# Patient Record
Sex: Male | Born: 1985 | Hispanic: No | Marital: Single | Smoking: Never smoker
Health system: Southern US, Community
[De-identification: ages and names within clinical notes are randomized; demographics above are authoritative.]

## PROBLEM LIST (undated history)

## (undated) DIAGNOSIS — F79 Unspecified intellectual disabilities: Secondary | ICD-10-CM

## (undated) DIAGNOSIS — F319 Bipolar disorder, unspecified: Secondary | ICD-10-CM

## (undated) DIAGNOSIS — R569 Unspecified convulsions: Secondary | ICD-10-CM

## (undated) DIAGNOSIS — G809 Cerebral palsy, unspecified: Secondary | ICD-10-CM

## (undated) DIAGNOSIS — F6381 Intermittent explosive disorder: Secondary | ICD-10-CM

---
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
## 2006-06-25 ENCOUNTER — Emergency Department (HOSPITAL_COMMUNITY): Admission: EM | Admit: 2006-06-25 | Discharge: 2006-06-25 | Payer: Self-pay | Admitting: Emergency Medicine

## 2006-07-11 ENCOUNTER — Emergency Department (HOSPITAL_COMMUNITY): Admission: EM | Admit: 2006-07-11 | Discharge: 2006-07-12 | Payer: Self-pay | Admitting: Emergency Medicine

## 2006-08-20 ENCOUNTER — Emergency Department (HOSPITAL_COMMUNITY): Admission: EM | Admit: 2006-08-20 | Discharge: 2006-08-20 | Payer: Self-pay | Admitting: Emergency Medicine

## 2006-11-27 ENCOUNTER — Ambulatory Visit: Payer: Self-pay | Admitting: Internal Medicine

## 2006-11-27 LAB — CONVERTED CEMR LAB
ALT: 19 units/L
AST: 29 units/L
Creatinine, Ser: 0.72 mg/dL
HDL: 46 mg/dL
Hemoglobin: 13.7 g/dL
LDL Cholesterol: 94 mg/dL
WBC: 5.5 10*3/uL

## 2006-12-27 ENCOUNTER — Ambulatory Visit: Payer: Self-pay | Admitting: Internal Medicine

## 2007-03-07 ENCOUNTER — Ambulatory Visit: Payer: Self-pay | Admitting: Internal Medicine

## 2007-03-14 ENCOUNTER — Ambulatory Visit: Payer: Self-pay | Admitting: Internal Medicine

## 2007-04-09 ENCOUNTER — Ambulatory Visit: Payer: Self-pay | Admitting: Internal Medicine

## 2007-04-16 ENCOUNTER — Ambulatory Visit: Payer: Self-pay | Admitting: Internal Medicine

## 2007-05-15 ENCOUNTER — Encounter: Admission: RE | Admit: 2007-05-15 | Discharge: 2007-08-13 | Payer: Self-pay | Admitting: Internal Medicine

## 2007-05-20 ENCOUNTER — Emergency Department (HOSPITAL_COMMUNITY): Admission: EM | Admit: 2007-05-20 | Discharge: 2007-05-20 | Payer: Self-pay | Admitting: Emergency Medicine

## 2007-05-27 ENCOUNTER — Ambulatory Visit: Payer: Self-pay | Admitting: Internal Medicine

## 2007-05-29 ENCOUNTER — Emergency Department (HOSPITAL_COMMUNITY): Admission: EM | Admit: 2007-05-29 | Discharge: 2007-05-29 | Payer: Self-pay | Admitting: Emergency Medicine

## 2007-06-04 ENCOUNTER — Ambulatory Visit: Payer: Self-pay | Admitting: Internal Medicine

## 2007-06-11 ENCOUNTER — Ambulatory Visit: Payer: Self-pay | Admitting: Internal Medicine

## 2007-06-20 ENCOUNTER — Ambulatory Visit: Payer: Self-pay | Admitting: Internal Medicine

## 2007-06-20 DIAGNOSIS — G808 Other cerebral palsy: Secondary | ICD-10-CM

## 2007-06-20 DIAGNOSIS — F319 Bipolar disorder, unspecified: Secondary | ICD-10-CM

## 2007-06-20 DIAGNOSIS — F6381 Intermittent explosive disorder: Secondary | ICD-10-CM | POA: Insufficient documentation

## 2007-06-20 DIAGNOSIS — F71 Moderate intellectual disabilities: Secondary | ICD-10-CM

## 2007-06-20 DIAGNOSIS — F911 Conduct disorder, childhood-onset type: Secondary | ICD-10-CM

## 2007-06-20 DIAGNOSIS — R569 Unspecified convulsions: Secondary | ICD-10-CM

## 2007-07-29 ENCOUNTER — Encounter (INDEPENDENT_AMBULATORY_CARE_PROVIDER_SITE_OTHER): Payer: Self-pay | Admitting: Internal Medicine

## 2007-08-01 ENCOUNTER — Telehealth (INDEPENDENT_AMBULATORY_CARE_PROVIDER_SITE_OTHER): Payer: Self-pay | Admitting: Internal Medicine

## 2007-08-20 ENCOUNTER — Encounter (INDEPENDENT_AMBULATORY_CARE_PROVIDER_SITE_OTHER): Payer: Self-pay | Admitting: Internal Medicine

## 2007-08-20 ENCOUNTER — Telehealth (INDEPENDENT_AMBULATORY_CARE_PROVIDER_SITE_OTHER): Payer: Self-pay | Admitting: Internal Medicine

## 2007-08-20 DIAGNOSIS — R269 Unspecified abnormalities of gait and mobility: Secondary | ICD-10-CM | POA: Insufficient documentation

## 2007-08-27 ENCOUNTER — Encounter (INDEPENDENT_AMBULATORY_CARE_PROVIDER_SITE_OTHER): Payer: Self-pay | Admitting: Internal Medicine

## 2007-08-28 ENCOUNTER — Encounter: Admission: RE | Admit: 2007-08-28 | Discharge: 2007-09-19 | Payer: Self-pay | Admitting: Internal Medicine

## 2007-09-02 ENCOUNTER — Encounter (INDEPENDENT_AMBULATORY_CARE_PROVIDER_SITE_OTHER): Payer: Self-pay | Admitting: Internal Medicine

## 2007-09-24 ENCOUNTER — Ambulatory Visit: Payer: Self-pay | Admitting: Internal Medicine

## 2007-09-30 ENCOUNTER — Encounter (INDEPENDENT_AMBULATORY_CARE_PROVIDER_SITE_OTHER): Payer: Self-pay | Admitting: Internal Medicine

## 2007-11-14 ENCOUNTER — Encounter (INDEPENDENT_AMBULATORY_CARE_PROVIDER_SITE_OTHER): Payer: Self-pay | Admitting: Internal Medicine

## 2007-12-17 DIAGNOSIS — J209 Acute bronchitis, unspecified: Secondary | ICD-10-CM

## 2007-12-20 ENCOUNTER — Encounter (INDEPENDENT_AMBULATORY_CARE_PROVIDER_SITE_OTHER): Payer: Self-pay | Admitting: Internal Medicine

## 2007-12-22 ENCOUNTER — Encounter (INDEPENDENT_AMBULATORY_CARE_PROVIDER_SITE_OTHER): Payer: Self-pay | Admitting: Internal Medicine

## 2008-01-05 ENCOUNTER — Emergency Department (HOSPITAL_COMMUNITY): Admission: EM | Admit: 2008-01-05 | Discharge: 2008-01-05 | Payer: Self-pay | Admitting: Emergency Medicine

## 2008-01-10 ENCOUNTER — Ambulatory Visit: Payer: Self-pay | Admitting: Internal Medicine

## 2008-01-10 ENCOUNTER — Telehealth (INDEPENDENT_AMBULATORY_CARE_PROVIDER_SITE_OTHER): Payer: Self-pay | Admitting: Internal Medicine

## 2008-01-21 ENCOUNTER — Encounter (INDEPENDENT_AMBULATORY_CARE_PROVIDER_SITE_OTHER): Payer: Self-pay | Admitting: Internal Medicine

## 2008-01-24 ENCOUNTER — Ambulatory Visit: Payer: Self-pay | Admitting: Internal Medicine

## 2008-01-26 ENCOUNTER — Encounter (INDEPENDENT_AMBULATORY_CARE_PROVIDER_SITE_OTHER): Payer: Self-pay | Admitting: Internal Medicine

## 2008-02-01 ENCOUNTER — Encounter (INDEPENDENT_AMBULATORY_CARE_PROVIDER_SITE_OTHER): Payer: Self-pay | Admitting: Internal Medicine

## 2008-02-27 ENCOUNTER — Encounter (INDEPENDENT_AMBULATORY_CARE_PROVIDER_SITE_OTHER): Payer: Self-pay | Admitting: Internal Medicine

## 2008-03-07 ENCOUNTER — Emergency Department (HOSPITAL_COMMUNITY): Admission: EM | Admit: 2008-03-07 | Discharge: 2008-03-07 | Payer: Self-pay | Admitting: Emergency Medicine

## 2008-03-23 ENCOUNTER — Encounter (INDEPENDENT_AMBULATORY_CARE_PROVIDER_SITE_OTHER): Payer: Self-pay | Admitting: Internal Medicine

## 2008-05-21 ENCOUNTER — Encounter (INDEPENDENT_AMBULATORY_CARE_PROVIDER_SITE_OTHER): Payer: Self-pay | Admitting: Internal Medicine

## 2008-05-22 ENCOUNTER — Ambulatory Visit: Payer: Self-pay | Admitting: Internal Medicine

## 2008-06-18 ENCOUNTER — Encounter (INDEPENDENT_AMBULATORY_CARE_PROVIDER_SITE_OTHER): Payer: Self-pay | Admitting: Internal Medicine

## 2008-06-22 ENCOUNTER — Encounter (INDEPENDENT_AMBULATORY_CARE_PROVIDER_SITE_OTHER): Payer: Self-pay | Admitting: Internal Medicine

## 2008-07-06 ENCOUNTER — Ambulatory Visit: Payer: Self-pay | Admitting: Internal Medicine

## 2008-07-22 ENCOUNTER — Telehealth (INDEPENDENT_AMBULATORY_CARE_PROVIDER_SITE_OTHER): Payer: Self-pay | Admitting: Internal Medicine

## 2008-07-28 ENCOUNTER — Encounter (INDEPENDENT_AMBULATORY_CARE_PROVIDER_SITE_OTHER): Payer: Self-pay | Admitting: Internal Medicine

## 2008-08-27 ENCOUNTER — Ambulatory Visit: Payer: Self-pay | Admitting: Internal Medicine

## 2008-08-30 LAB — CONVERTED CEMR LAB: Valproic Acid Lvl: 106.9 ug/mL — ABNORMAL HIGH (ref 50.0–100.0)

## 2008-09-02 ENCOUNTER — Telehealth (INDEPENDENT_AMBULATORY_CARE_PROVIDER_SITE_OTHER): Payer: Self-pay | Admitting: Internal Medicine

## 2008-09-28 ENCOUNTER — Ambulatory Visit: Payer: Self-pay | Admitting: Internal Medicine

## 2008-10-05 LAB — CONVERTED CEMR LAB: Valproic Acid Lvl: 83.2 ug/mL (ref 50.0–100.0)

## 2008-10-28 ENCOUNTER — Encounter (INDEPENDENT_AMBULATORY_CARE_PROVIDER_SITE_OTHER): Payer: Self-pay | Admitting: Internal Medicine

## 2008-11-30 ENCOUNTER — Encounter (INDEPENDENT_AMBULATORY_CARE_PROVIDER_SITE_OTHER): Payer: Self-pay | Admitting: Internal Medicine

## 2008-12-11 ENCOUNTER — Telehealth (INDEPENDENT_AMBULATORY_CARE_PROVIDER_SITE_OTHER): Payer: Self-pay | Admitting: Internal Medicine

## 2008-12-30 ENCOUNTER — Encounter (INDEPENDENT_AMBULATORY_CARE_PROVIDER_SITE_OTHER): Payer: Self-pay | Admitting: Internal Medicine

## 2009-01-04 ENCOUNTER — Encounter (INDEPENDENT_AMBULATORY_CARE_PROVIDER_SITE_OTHER): Payer: Self-pay | Admitting: Internal Medicine

## 2009-01-25 ENCOUNTER — Encounter (INDEPENDENT_AMBULATORY_CARE_PROVIDER_SITE_OTHER): Payer: Self-pay | Admitting: Internal Medicine

## 2009-02-15 ENCOUNTER — Encounter (INDEPENDENT_AMBULATORY_CARE_PROVIDER_SITE_OTHER): Payer: Self-pay | Admitting: Internal Medicine

## 2009-03-17 ENCOUNTER — Ambulatory Visit: Payer: Self-pay | Admitting: Internal Medicine

## 2009-03-18 ENCOUNTER — Emergency Department (HOSPITAL_COMMUNITY): Admission: EM | Admit: 2009-03-18 | Discharge: 2009-03-18 | Payer: Self-pay | Admitting: *Deleted

## 2009-04-16 ENCOUNTER — Telehealth (INDEPENDENT_AMBULATORY_CARE_PROVIDER_SITE_OTHER): Payer: Self-pay | Admitting: Internal Medicine

## 2009-05-04 ENCOUNTER — Encounter (INDEPENDENT_AMBULATORY_CARE_PROVIDER_SITE_OTHER): Payer: Self-pay | Admitting: Internal Medicine

## 2009-05-05 ENCOUNTER — Emergency Department (HOSPITAL_COMMUNITY): Admission: EM | Admit: 2009-05-05 | Discharge: 2009-05-05 | Payer: Self-pay | Admitting: Family Medicine

## 2009-05-19 ENCOUNTER — Encounter (INDEPENDENT_AMBULATORY_CARE_PROVIDER_SITE_OTHER): Payer: Self-pay | Admitting: Internal Medicine

## 2009-05-25 LAB — CONVERTED CEMR LAB: Valproic Acid Lvl: 104.6 ug/mL — ABNORMAL HIGH (ref 50.0–100.0)

## 2009-05-31 ENCOUNTER — Emergency Department (HOSPITAL_COMMUNITY): Admission: EM | Admit: 2009-05-31 | Discharge: 2009-05-31 | Payer: Self-pay | Admitting: Emergency Medicine

## 2009-05-31 ENCOUNTER — Encounter (INDEPENDENT_AMBULATORY_CARE_PROVIDER_SITE_OTHER): Payer: Self-pay | Admitting: Internal Medicine

## 2009-08-13 ENCOUNTER — Ambulatory Visit: Payer: Self-pay | Admitting: Internal Medicine

## 2009-08-30 LAB — CONVERTED CEMR LAB
Alkaline Phosphatase: 73 units/L (ref 39–117)
BUN: 17 mg/dL (ref 6–23)
CO2: 25 meq/L (ref 19–32)
Creatinine, Ser: 0.85 mg/dL (ref 0.40–1.50)
Hemoglobin: 14.1 g/dL (ref 13.0–17.0)
MCHC: 30.7 g/dL (ref 30.0–36.0)
MCV: 85.8 fL (ref 78.0–100.0)
Potassium: 4.7 meq/L (ref 3.5–5.3)
RBC: 5.35 M/uL (ref 4.22–5.81)
RDW: 14.2 % (ref 11.5–15.5)
Sodium: 142 meq/L (ref 135–145)
Total Bilirubin: 0.4 mg/dL (ref 0.3–1.2)
Total Protein: 7.8 g/dL (ref 6.0–8.3)
WBC: 5.2 10*3/uL (ref 4.0–10.5)

## 2010-02-02 ENCOUNTER — Encounter: Admission: RE | Admit: 2010-02-02 | Discharge: 2010-02-02 | Payer: Self-pay | Admitting: Neurology

## 2010-06-19 ENCOUNTER — Emergency Department (HOSPITAL_COMMUNITY): Admission: EM | Admit: 2010-06-19 | Discharge: 2010-06-19 | Payer: Self-pay | Admitting: Emergency Medicine

## 2010-12-22 ENCOUNTER — Telehealth (INDEPENDENT_AMBULATORY_CARE_PROVIDER_SITE_OTHER): Payer: Self-pay | Admitting: Internal Medicine

## 2011-01-10 NOTE — Progress Notes (Signed)
Summary: Needs PA for Clobex -- NO LONGER OUR PATIENT  Phone Note Outgoing Call   Summary of Call: Received prior authorization request for Clobex .  Pt. doesn't show a history of having tried and failed preferred meds.  Does not seem to have been refilled recently.  Do you want to change this medication to a preferred?  Preferred list in your refill rack.  Initial call taken by: Garrett Quint RN,  December 22, 2010 12:54 PM  Follow-up for Phone Call        I do not believe he is still a patient here--need to call pharmacy or [facility] where he lives and clarify. Has not been seen here since 2010 Follow-up by: Garrett Manson MD,  January 03, 2011 8:53 AM  Additional Follow-up for Phone Call Additional follow up Details #1::        Spoke with Garrett Shaffer at pharmacy and Garrett Shaffer at [facility].  Per [facility], Garrett Shaffer is no longer his provider; he is being followed by a Garrett Shaffer.  I called [facility] and informed them to change pt.'s provider to Garrett Shaffer and to call [facility] for any further clarification.  Garrett Quint RN  January 03, 2011 12:26 PM

## 2011-03-02 ENCOUNTER — Ambulatory Visit: Payer: Medicaid Other | Attending: Family Medicine | Admitting: Physical Therapy

## 2011-03-02 DIAGNOSIS — IMO0001 Reserved for inherently not codable concepts without codable children: Secondary | ICD-10-CM | POA: Insufficient documentation

## 2011-03-02 DIAGNOSIS — F79 Unspecified intellectual disabilities: Secondary | ICD-10-CM | POA: Insufficient documentation

## 2011-03-02 DIAGNOSIS — R269 Unspecified abnormalities of gait and mobility: Secondary | ICD-10-CM | POA: Insufficient documentation

## 2011-03-02 DIAGNOSIS — G809 Cerebral palsy, unspecified: Secondary | ICD-10-CM | POA: Insufficient documentation
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
## 2011-03-31 NOTE — Op Note (Signed)
NAME:  Garrett Shaffer, MALEK                          ACCOUNT NO.:  [id number]   MEDICAL RECORD NO.:  [id number]                   PATIENT TYPE:  INP   LOCATION:  2302                                 FACILITY:  [facility]   PHYSICIAN:  Mark C. Ophelia Charter, M.D.                 DATE OF BIRTH:  10/09/1986   DATE OF PROCEDURE:  05/16/2004  DATE OF DISCHARGE:                                 OPERATIVE REPORT   PREOPERATIVE DIAGNOSIS:  Right proximal forearm laceration with significant  arterial injury.   POSTOPERATIVE DIAGNOSIS:  Right forearm laceration, laceration of  brachioradialis, superficialis, radial and ulnar arteries and median nerve.   OPERATION PERFORMED:  Exploration and repair of median nerve,  brachioradialis and superficialis muscle.   SURGEON:  Mark C. Ophelia Charter, M.D.   ASSISTANT:  Balinda Quails, M.D.   ANESTHESIA:  GOT.   NOTE:  Balinda Quails, M.D. performed the radial and ulnar artery repair and  I assisted him with this.  I made the exposure.  Dr. Madilyn Fireman performed the  vessel repairs and then the operative microscope was brought in and I  performed the median nerve repair with Dr. Madilyn Fireman assisting.   COMPLICATIONS:   INDICATIONS FOR PROCEDURE:  The patient had a volar approximately 5 cm  laceration with packing placed and had a tourniquet on.  I did not see the  patient in the emergency room.  I was called by Dr. Luan Pulling on an  emergent basis and arrived at the hospital five minutes later.  He was in  the operating room.    Distally, the arm was prepped and I covered it with stockinette and held it  while the nonsterile tourniquet was removed and pressure was held directly  over the wound to prevent blood loss and the proximal arm was prepped by Dr.  Madilyn Fireman and then standard draping was performed for the extremity.  Split  sheets were used.  Incision was extended distally on the radial side and  proximally on the ulnar side passing across the antecubital crease in an  oblique  manner.  Dr. Madilyn Fireman performed the dissection.  Brachioradialis muscle  was cut.  There were lacerations to the radial and ulnar artery, please see  the description and this was present near the arcade with numerous small  veins and arteries.  After he performed the radial and ulnar artery repair,  the distal median nerve was present and the median nerve was followed from  proximal and I dissected out the proximal portion of the nerve, mobilized  it.  With the elbow flexed wrist flexed for the artery repair, once artery  repairs were repeated, operating microscope was brought in using the Leica  scope adjusted.  A 25 needle was placed in the distal nerve holding it and 8-  0 nylon epineurial sutures were used. The anterior interosseous branch was  intact and arch bundle sensory branch  was repaired.  There was a smaller  group of fascicles which may have been the motor portion of the median nerve  smaller and this was repaired as well.  A total of about 10 sutures were  placed until all __________ fascicles were tucked in nicely.  After  irrigation with saline solution, brachioradialis was repaired with 2-0  Vicryl superficial repair of the sublimis muscle belly was performed.  Distal head of the biceps tendon was not lacerated and was intact.  After  irrigation with saline solution, 2-0 Vicryl was placed in the subcutaneous  tissue and skin staple closure.  Posterior splint was applied with the wrist  45 degrees flexed, elbow at 90.  Dopplers were used and radial artery was  marked with skin marker and splint was fashioned so that there was exposure  to the radial artery for Doppler testing  by the nurses in the intensive  care postop.  Since the patient had massive blood loss, had hyperkalemia,  had some arrhythmias and a significant amount of transfusions, he was  transferred directly from the operating room to the ICU intubated.  The  patient tolerated the procedure well.                                                Mark C. Ophelia Charter, M.D.    MCY/MEDQ  D:  05/16/2004  T:  05/17/2004  Job:  [id number]
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
## 2011-09-20 ENCOUNTER — Encounter: Payer: Self-pay | Admitting: Adult Health

## 2011-09-20 ENCOUNTER — Emergency Department (HOSPITAL_COMMUNITY)
Admission: EM | Admit: 2011-09-20 | Discharge: 2011-09-21 | Disposition: A | Discharge status: Home / Self Care | Payer: Medicaid Other | Attending: Emergency Medicine | Admitting: Emergency Medicine

## 2011-09-20 DIAGNOSIS — F79 Unspecified intellectual disabilities: Secondary | ICD-10-CM | POA: Insufficient documentation

## 2011-09-20 DIAGNOSIS — K625 Hemorrhage of anus and rectum: Secondary | ICD-10-CM

## 2011-09-20 DIAGNOSIS — Z79899 Other long term (current) drug therapy: Secondary | ICD-10-CM | POA: Insufficient documentation

## 2011-09-20 DIAGNOSIS — F319 Bipolar disorder, unspecified: Secondary | ICD-10-CM | POA: Insufficient documentation

## 2011-09-20 HISTORY — DX: Unspecified convulsions: R56.9

## 2011-09-20 HISTORY — DX: Intermittent explosive disorder: F63.81

## 2011-09-20 HISTORY — DX: Cerebral palsy, unspecified: G80.9

## 2011-09-20 HISTORY — DX: Unspecified intellectual disabilities: F79

## 2011-09-20 HISTORY — DX: Bipolar disorder, unspecified: F31.9

## 2011-09-20 LAB — CBC
Hemoglobin: 13.9 g/dL (ref 13.0–17.0)
MCH: 25.9 pg — ABNORMAL LOW (ref 26.0–34.0)
Platelets: 232 10*3/uL (ref 150–400)
RBC: 5.36 MIL/uL (ref 4.22–5.81)
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
## 2011-09-20 NOTE — ED Notes (Signed)
SANE nurse here to see pt 
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
## 2011-09-20 NOTE — ED Notes (Signed)
SANE nurse states she is done with her assessment

## 2011-09-20 NOTE — ED Notes (Signed)
[employer] talking with room 1

## 2011-09-26 ENCOUNTER — Encounter: Payer: Self-pay | Admitting: Gastroenterology

## 2011-10-16 ENCOUNTER — Ambulatory Visit: Payer: Medicaid Other | Admitting: Gastroenterology

## 2013-05-07 ENCOUNTER — Encounter (HOSPITAL_COMMUNITY): Payer: Self-pay | Admitting: Emergency Medicine

## 2013-05-07 ENCOUNTER — Emergency Department (HOSPITAL_COMMUNITY)
Admission: EM | Admit: 2013-05-07 | Discharge: 2013-05-07 | Payer: Medicaid Other | Attending: Emergency Medicine | Admitting: Emergency Medicine

## 2013-05-07 DIAGNOSIS — F911 Conduct disorder, childhood-onset type: Secondary | ICD-10-CM | POA: Insufficient documentation

## 2013-05-07 DIAGNOSIS — Z8659 Personal history of other mental and behavioral disorders: Secondary | ICD-10-CM | POA: Insufficient documentation

## 2013-05-07 DIAGNOSIS — F319 Bipolar disorder, unspecified: Secondary | ICD-10-CM | POA: Insufficient documentation

## 2013-05-07 DIAGNOSIS — IMO0002 Reserved for concepts with insufficient information to code with codable children: Secondary | ICD-10-CM | POA: Insufficient documentation

## 2013-05-07 DIAGNOSIS — R4689 Other symptoms and signs involving appearance and behavior: Secondary | ICD-10-CM

## 2013-05-07 DIAGNOSIS — Z8669 Personal history of other diseases of the nervous system and sense organs: Secondary | ICD-10-CM | POA: Insufficient documentation

## 2013-05-07 DIAGNOSIS — T07XXXA Unspecified multiple injuries, initial encounter: Secondary | ICD-10-CM

## 2013-05-07 DIAGNOSIS — X838XXA Intentional self-harm by other specified means, initial encounter: Secondary | ICD-10-CM | POA: Insufficient documentation

## 2013-05-07 DIAGNOSIS — G40909 Epilepsy, unspecified, not intractable, without status epilepticus: Secondary | ICD-10-CM | POA: Insufficient documentation

## 2013-05-07 DIAGNOSIS — Z79899 Other long term (current) drug therapy: Secondary | ICD-10-CM | POA: Insufficient documentation

## 2013-05-07 LAB — BASIC METABOLIC PANEL
CO2: 26 mEq/L (ref 19–32)
Chloride: 99 mEq/L (ref 96–112)
Creatinine, Ser: 0.75 mg/dL (ref 0.50–1.35)
GFR calc Af Amer: >90 mL/min (ref >90)
Potassium: 4.2 mEq/L (ref 3.5–5.1)
Sodium: 136 mEq/L (ref 135–145)

## 2013-05-07 LAB — CARBAMAZEPINE LEVEL, TOTAL: Carbamazepine Lvl: 4.8 ug/mL (ref 4.0–12.0)

## 2013-05-07 LAB — VALPROIC ACID LEVEL: Valproic Acid Lvl: 51.8 ug/mL (ref 50.0–100.0)
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
## 2013-05-07 NOTE — ED Notes (Signed)
Bed:WA11<BR> Expected date:<BR> Expected time:<BR> Means of arrival:<BR> Comments:<BR> EMS
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
## 2013-05-07 NOTE — ED Provider Notes (Signed)
See prior note   Ward Givens, MD 05/07/13 2302

## 2013-08-02 ENCOUNTER — Encounter (HOSPITAL_COMMUNITY): Payer: Self-pay | Admitting: Emergency Medicine

## 2013-08-02 ENCOUNTER — Emergency Department (HOSPITAL_COMMUNITY)
Admission: EM | Admit: 2013-08-02 | Discharge: 2013-08-03 | Disposition: A | Discharge status: Home / Self Care | Payer: Medicaid Other | Attending: Emergency Medicine | Admitting: Emergency Medicine

## 2013-08-02 DIAGNOSIS — G40909 Epilepsy, unspecified, not intractable, without status epilepticus: Secondary | ICD-10-CM | POA: Insufficient documentation

## 2013-08-02 DIAGNOSIS — F319 Bipolar disorder, unspecified: Secondary | ICD-10-CM | POA: Insufficient documentation

## 2013-08-02 DIAGNOSIS — Z79899 Other long term (current) drug therapy: Secondary | ICD-10-CM | POA: Insufficient documentation

## 2013-08-02 DIAGNOSIS — F6381 Intermittent explosive disorder: Secondary | ICD-10-CM | POA: Insufficient documentation

## 2013-08-02 LAB — CBC
HCT: 42.2 % (ref 39.0–52.0)
Hemoglobin: 13.3 g/dL (ref 13.0–17.0)
MCH: 26.3 pg (ref 26.0–34.0)
MCHC: 31.5 g/dL (ref 30.0–36.0)
MCV: 83.4 fL (ref 78.0–100.0)
Platelets: 276 10*3/uL (ref 150–400)
RBC: 5.06 MIL/uL (ref 4.22–5.81)
RDW: 14.2 % (ref 11.5–15.5)
WBC: 9.3 10*3/uL (ref 4.0–10.5)

## 2013-08-02 LAB — RAPID URINE DRUG SCREEN, HOSP PERFORMED
Amphetamines: NOT DETECTED
Barbiturates: NOT DETECTED
Benzodiazepines: NOT DETECTED
Cocaine: NOT DETECTED
Opiates: NOT DETECTED
Tetrahydrocannabinol: NOT DETECTED
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
## 2013-08-03 ENCOUNTER — Encounter (HOSPITAL_COMMUNITY): Payer: Self-pay | Admitting: *Deleted

## 2013-08-03 LAB — ACETAMINOPHEN LEVEL: Acetaminophen (Tylenol), Serum: <15 ug/mL (ref 10–30)

## 2013-08-03 LAB — COMPREHENSIVE METABOLIC PANEL
ALT: 31 U/L (ref 0–53)
Albumin: 3.6 g/dL (ref 3.5–5.2)
Alkaline Phosphatase: 112 U/L (ref 39–117)
BUN: 10 mg/dL (ref 6–23)
Chloride: 98 mEq/L (ref 96–112)
GFR calc Af Amer: >90 mL/min (ref >90)
Glucose, Bld: 117 mg/dL — ABNORMAL HIGH (ref 70–99)
Potassium: 4.3 mEq/L (ref 3.5–5.1)
Sodium: 137 mEq/L (ref 135–145)
Total Bilirubin: 0.2 mg/dL — ABNORMAL LOW (ref 0.3–1.2)
Total Protein: 8.2 g/dL (ref 6.0–8.3)

## 2013-08-03 NOTE — ED Provider Notes (Signed)
Medical screening examination/treatment/procedure(s) were performed by non-physician practitioner and as supervising physician I was immediately available for consultation/collaboration.   Gilda Crease, MD 08/03/13 [phone]

## 2013-08-03 NOTE — Progress Notes (Signed)
Reviewed the information documented and agree with the treatment plan.  Harm Jou,JANARDHAHA R. 08/03/2013 3:51 PM
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
## 2015-08-20 ENCOUNTER — Emergency Department (HOSPITAL_COMMUNITY): Payer: Medicaid Other

## 2015-08-20 ENCOUNTER — Inpatient Hospital Stay (HOSPITAL_COMMUNITY)
Admission: EM | Admit: 2015-08-20 | Discharge: 2015-08-24 | DRG: 916 | Disposition: A | Discharge status: Home / Self Care | Payer: Medicaid Other | Attending: Internal Medicine | Admitting: Internal Medicine

## 2015-08-20 ENCOUNTER — Encounter (HOSPITAL_COMMUNITY): Payer: Self-pay | Admitting: *Deleted

## 2015-08-20 DIAGNOSIS — G40909 Epilepsy, unspecified, not intractable, without status epilepticus: Secondary | ICD-10-CM | POA: Diagnosis present

## 2015-08-20 DIAGNOSIS — K219 Gastro-esophageal reflux disease without esophagitis: Secondary | ICD-10-CM | POA: Diagnosis present

## 2015-08-20 DIAGNOSIS — F6381 Intermittent explosive disorder: Secondary | ICD-10-CM | POA: Diagnosis present

## 2015-08-20 DIAGNOSIS — F3162 Bipolar disorder, current episode mixed, moderate: Secondary | ICD-10-CM | POA: Insufficient documentation

## 2015-08-20 DIAGNOSIS — E872 Acidosis, unspecified: Secondary | ICD-10-CM | POA: Insufficient documentation

## 2015-08-20 DIAGNOSIS — R05 Cough: Secondary | ICD-10-CM

## 2015-08-20 DIAGNOSIS — R061 Stridor: Secondary | ICD-10-CM | POA: Diagnosis present

## 2015-08-20 DIAGNOSIS — R0902 Hypoxemia: Secondary | ICD-10-CM | POA: Diagnosis present

## 2015-08-20 DIAGNOSIS — J988 Other specified respiratory disorders: Secondary | ICD-10-CM | POA: Insufficient documentation

## 2015-08-20 DIAGNOSIS — D72829 Elevated white blood cell count, unspecified: Secondary | ICD-10-CM | POA: Insufficient documentation

## 2015-08-20 DIAGNOSIS — F79 Unspecified intellectual disabilities: Secondary | ICD-10-CM | POA: Diagnosis present

## 2015-08-20 DIAGNOSIS — G809 Cerebral palsy, unspecified: Secondary | ICD-10-CM | POA: Diagnosis present

## 2015-08-20 DIAGNOSIS — R059 Cough, unspecified: Secondary | ICD-10-CM

## 2015-08-20 DIAGNOSIS — T782XXA Anaphylactic shock, unspecified, initial encounter: Secondary | ICD-10-CM | POA: Diagnosis present

## 2015-08-20 DIAGNOSIS — R0603 Acute respiratory distress: Secondary | ICD-10-CM | POA: Insufficient documentation

## 2015-08-20 DIAGNOSIS — F319 Bipolar disorder, unspecified: Secondary | ICD-10-CM | POA: Diagnosis present

## 2015-08-20 LAB — CBC WITH DIFFERENTIAL/PLATELET
Basophils Absolute: 0 10*3/uL (ref 0.0–0.1)
Basophils Relative: 0 %
Eosinophils Absolute: 0.4 10*3/uL (ref 0.0–0.7)
Eosinophils Relative: 3 %
HEMATOCRIT: 42 % (ref 39.0–52.0)
HEMOGLOBIN: 13.6 g/dL (ref 13.0–17.0)
LYMPHS ABS: 3.4 10*3/uL (ref 0.7–4.0)
LYMPHS PCT: 27 %
MCH: 27.1 pg (ref 26.0–34.0)
MCHC: 32.4 g/dL (ref 30.0–36.0)
MCV: 83.8 fL (ref 78.0–100.0)
MONOS PCT: 10 %
Monocytes Absolute: 1.3 10*3/uL — ABNORMAL HIGH (ref 0.1–1.0)
NEUTROS ABS: 7.4 10*3/uL (ref 1.7–7.7)
NEUTROS PCT: 60 %
Platelets: 237 10*3/uL (ref 150–400)
RBC: 5.01 MIL/uL (ref 4.22–5.81)
RDW: 13.9 % (ref 11.5–15.5)
WBC: 12.5 10*3/uL — ABNORMAL HIGH (ref 4.0–10.5)

## 2015-08-20 LAB — I-STAT CG4 LACTIC ACID, ED: LACTIC ACID, VENOUS: 1.89 mmol/L (ref 0.5–2.0)

## 2015-08-20 LAB — I-STAT ARTERIAL BLOOD GAS, ED
ACID-BASE EXCESS: 1 mmol/L (ref 0.0–2.0)
BICARBONATE: 25.3 meq/L — AB (ref 20.0–24.0)
O2 SAT: 98 %
PCO2 ART: 38.1 mmHg (ref 35.0–45.0)
PO2 ART: 96 mmHg (ref 80.0–100.0)
Patient temperature: 98.7
TCO2: 26 mmol/L (ref 0–100)
pH, Arterial: 7.43 (ref 7.350–7.450)

## 2015-08-20 LAB — BASIC METABOLIC PANEL
ANION GAP: 10 (ref 5–15)
BUN: 6 mg/dL (ref 6–20)
CHLORIDE: 99 mmol/L — AB (ref 101–111)
CO2: 24 mmol/L (ref 22–32)
CREATININE: 0.71 mg/dL (ref 0.61–1.24)
Calcium: 8.9 mg/dL (ref 8.9–10.3)
GFR calc non Af Amer: >60 mL/min (ref >60)
Glucose, Bld: 125 mg/dL — ABNORMAL HIGH (ref 65–99)
POTASSIUM: 4.2 mmol/L (ref 3.5–5.1)
Sodium: 133 mmol/L — ABNORMAL LOW (ref 135–145)

## 2015-08-20 LAB — I-STAT TROPONIN, ED: Troponin i, poc: 0 ng/mL (ref 0.00–0.08)

## 2015-08-20 MED ORDER — RACEPINEPHRINE HCL 2.25 % IN NEBU
INHALATION_SOLUTION | RESPIRATORY_TRACT | Status: AC
  Filled 2015-08-20: qty 0.5

## 2015-08-20 MED ORDER — ALBUTEROL (5 MG/ML) CONTINUOUS INHALATION SOLN
INHALATION_SOLUTION | RESPIRATORY_TRACT | Status: AC
  Filled 2015-08-20: qty 20

## 2015-08-20 MED ORDER — METHYLPREDNISOLONE SODIUM SUCC 125 MG IJ SOLR
INTRAMUSCULAR | Status: AC
  Administered 2015-08-20: 125 mg
  Filled 2015-08-20: qty 2

## 2015-08-20 MED ORDER — IPRATROPIUM BROMIDE 0.02 % IN SOLN
0.5000 mg | Freq: Once | RESPIRATORY_TRACT | Status: AC
  Administered 2015-08-20: 0.5 mg via RESPIRATORY_TRACT

## 2015-08-20 MED ORDER — RACEPINEPHRINE HCL 2.25 % IN NEBU
0.5000 mL | INHALATION_SOLUTION | Freq: Once | RESPIRATORY_TRACT | Status: AC
  Administered 2015-08-20: 0.5 mL via RESPIRATORY_TRACT

## 2015-08-20 MED ORDER — ALBUTEROL (5 MG/ML) CONTINUOUS INHALATION SOLN
20.0000 mg | INHALATION_SOLUTION | Freq: Once | RESPIRATORY_TRACT | Status: AC
  Administered 2015-08-20: 20 mg via RESPIRATORY_TRACT

## 2015-08-20 MED ORDER — EPINEPHRINE 0.3 MG/0.3ML IJ SOAJ
0.3000 mg | Freq: Once | INTRAMUSCULAR | Status: AC
  Administered 2015-08-20: 0.3 mg via INTRAMUSCULAR
  Filled 2015-08-20: qty 0.3

## 2015-08-20 MED ORDER — ALBUTEROL SULFATE (2.5 MG/3ML) 0.083% IN NEBU
5.0000 mg | INHALATION_SOLUTION | Freq: Once | RESPIRATORY_TRACT | Status: AC
Start: 2015-08-20 — End: 2015-08-20
  Administered 2015-08-20: 5 mg via RESPIRATORY_TRACT

## 2015-08-20 NOTE — ED Notes (Signed)
Sob and wheezing  Today  Audible wheezes  No hx of asthma

## 2015-08-20 NOTE — ED Notes (Signed)
Called lab to add on d-dimer.  

## 2015-08-20 NOTE — ED Provider Notes (Signed)
CSN: [id number]     Arrival date & time 08/20/15  2039 History   First MD Initiated Contact with Patient 08/20/15 2055     Chief Complaint  Patient presents with  . Shortness of Breath   GRACE HAGGART is a 29 y.o. male with a past medical history significant for bipolar disorder, cervical pulses, seizures, and MR who presents for respiratory distress and wheezing. The patient is brought in by EMS and one of his group home supervisors. They report that the patient was of normal health until approximately 2 hours prior to arrival when he had gradually worsening shortness of breath and wheezing. The patient has no history of asthma. The patient was extremely distressed, had audible wheezing and stridor, was tripoding, and was breathing approximately 30 times a minute. The patient and his supervisor deny any new exposures to foods, medicines, implants, and the patient denies any bee stings.   (Consider location/radiation/quality/duration/timing/severity/associated sxs/prior Treatment) Patient is a 29 y.o. male presenting with shortness of breath. The history is provided by the patient, a caregiver and the EMS personnel. No language interpreter was used.  Shortness of Breath Severity:  Severe Onset quality:  Gradual Duration:  2 hours Timing:  Constant Progression:  Worsening Chronicity:  New Context: URI   Context: not emotional upset, not known allergens, not occupational exposure, not smoke exposure and not strong odors   Relieved by:  Nothing Worsened by:  Nothing tried Ineffective treatments:  Oxygen Associated symptoms: diaphoresis, sore throat and wheezing   Associated symptoms: no abdominal pain, no chest pain, no cough, no fever, no headaches, no hemoptysis, no neck pain, no rash, no sputum production, no syncope and no vomiting   Sore throat:    Severity:  Moderate   Duration:  3 days   Timing:  Constant   Progression:  Unchanged Wheezing:    Severity:  Severe   Onset quality:   Gradual   Duration:  2 hours   Timing:  Constant   Progression:  Worsening   Chronicity:  New   Past Medical History  Diagnosis Date  . Mental retardation   . Bipolar 1 disorder (HCC)   . Intermittent explosive disorder   . Cerebral palsy (HCC)   . Seizures (HCC)    History reviewed. No pertinent past surgical history. No family history on file. Social History  Substance Use Topics  . Smoking status: Never Smoker   . Smokeless tobacco: Never Used  . Alcohol Use: No     Comment: Pt denies     Review of Systems  Unable to perform ROS Constitutional: Positive for diaphoresis. Negative for fever.  HENT: Positive for congestion and sore throat.   Respiratory: Positive for chest tightness, shortness of breath, wheezing and stridor. Negative for cough, hemoptysis and sputum production.   Cardiovascular: Negative for chest pain, palpitations, leg swelling and syncope.  Gastrointestinal: Negative for nausea, vomiting, abdominal pain, diarrhea and constipation.  Genitourinary: Negative for dysuria.  Musculoskeletal: Negative for back pain, neck pain and neck stiffness.  Skin: Negative for rash and wound.  Neurological: Negative for headaches.  Psychiatric/Behavioral: Negative for confusion and agitation.  All other systems reviewed and are negative.     Allergies  Review of patient's allergies indicates no known allergies.  Home Medications   Prior to Admission medications   Medication Sig Start Date End Date Taking? Authorizing Provider  ARIPiprazole (ABILIFY) 15 MG tablet Take 15 mg by mouth daily.      Historical Provider, MD  carbamazepine (TEGRETOL) 200 MG tablet Take 200 mg by mouth 2 (two) times daily.      Historical Provider, MD  clonazePAM (KLONOPIN) 0.5 MG tablet Take 0.5 mg by mouth at bedtime as needed. FOR SLEEP    Historical Provider, MD  divalproex (DEPAKOTE) 500 MG DR tablet Take 1,000 mg by mouth at bedtime.     Historical Provider, MD  gabapentin  (NEURONTIN) 600 MG tablet Take 1,200 mg by mouth at bedtime.     Historical Provider, MD  propranolol (INNOPRAN XL) 120 MG 24 hr capsule Take 120 mg by mouth daily.     Historical Provider, MD   BP 129/94 mmHg  Pulse 86  Temp(Src)   Resp 30  SpO2 98% Physical Exam  Constitutional: He appears well-developed and well-nourished. He appears distressed.  HENT:  Head: Normocephalic.  Mouth/Throat: No oropharyngeal exudate.  Eyes: Conjunctivae are normal. Pupils are equal, round, and reactive to light.  Neck: Normal range of motion.  Cardiovascular: Normal rate, normal heart sounds and intact distal pulses.   No murmur heard. Pulmonary/Chest: Stridor present. Tachypnea noted. He is in respiratory distress. He has wheezes. He exhibits no tenderness.  Abdominal: Soft. He exhibits no distension. There is no tenderness. There is no rebound.  Musculoskeletal: He exhibits no tenderness.  Neurological: He is alert. He exhibits normal muscle tone.  Skin: Skin is warm. He is diaphoretic. No erythema.  Psychiatric: He has a normal mood and affect.  Nursing note and vitals reviewed.   ED Course  Procedures (including critical care time) Labs Review Labs Reviewed  BASIC METABOLIC PANEL - Abnormal; Notable for the following:    Sodium 133 (*)    Chloride 99 (*)    Glucose, Bld 125 (*)    All other components within normal limits  CBC WITH DIFFERENTIAL/PLATELET - Abnormal; Notable for the following:    WBC 12.5 (*)    Monocytes Absolute 1.3 (*)    All other components within normal limits  I-STAT ARTERIAL BLOOD GAS, ED - Abnormal; Notable for the following:    Bicarbonate 25.3 (*)    All other components within normal limits  I-STAT CG4 LACTIC ACID, ED - Abnormal; Notable for the following:    Lactic Acid, Venous 2.69 (*)    All other components within normal limits  D-DIMER, QUANTITATIVE (NOT AT [facility])  I-STAT TROPOININ, ED  I-STAT CG4 LACTIC ACID, ED    Imaging Review Dg Neck Soft  Tissue  08/20/2015   CLINICAL DATA:  Dyspnea and cough for 1 day.  EXAM: NECK SOFT TISSUES - 1+ VIEW  COMPARISON:  None.  FINDINGS: There is no evidence of retropharyngeal soft tissue swelling or epiglottic enlargement. The cervical airway is unremarkable and no radio-opaque foreign body identified.  IMPRESSION: Negative.   Electronically Signed   By: Ellery Plunk M.D.   On: 08/20/2015 21:41   Dg Chest Portable 1 View  08/20/2015   CLINICAL DATA:  Severe shortness of breath.  Diaphoresis.  EXAM: PORTABLE CHEST 1 VIEW  COMPARISON:  06/19/2010  FINDINGS: Stents of artifact overlies the chest. Heart size is normal. Mediastinal shadows are normal. The lungs are clear. No effusions. No bony abnormality. Distention of the stomach.  IMPRESSION: Extensive overlying artifact.  No active disease identified.   Electronically Signed   By: Paulina Fusi M.D.   On: 08/20/2015 21:36   I have personally reviewed and evaluated these images and lab results as part of my medical decision-making.   EKG Interpretation None  MDM   DORRELL MITCHELTREE is a 29 y.o. male with a past medical history significant for bipolar disorder, cervical pulses, seizures, and MR who presents for respiratory distress and wheezing. On arrival, the patient was in respiratory distress with significant wheezing, stridor, Tripoding, and tachypnea. The patient was not able to answer more than one word answers. The patient arrived with a non-breather mask on for oxygen supplementation. The patient was given Solu-Medrol and albuterol by EMS. The patient maintained his oxygen saturation with the oxygen supplementation.   A bedside echo was performed by the emergency team and did not reveal a fusion or significant abnormality. Bedside ultrasound did not reveal pneumothorax. The patient's breath sounds were symmetric with wheezing and coarseness.  Initial inspection of the oropharynx did not reveal erythema, uvular deviation, or edema. The  patient on exam appeared to have stridor as well as some mild wheezing. The decision was made to give the patient racemic epinephrine. Further discussion with the patient's caregiver and the patient did not reveal a allergic source however, with the concern for possible upper airway swelling, epinephrine was ordered.  The patient had a chest x-ray which appeared clear and a lateral neck which did not reveal abnormality. The patient had improvement in his respiratory exam with gradual improvement in aeration. The patient subjectively appeared more comfortable with his breathing and his respiratory rate slowed. The patient did not have any tachycardia.  The patient had laboratory tests performed. The patient's D dimer was negative.The patient had a slight leukocytosis of 12.5.The patient's electrolytes were grossly unremarkable. The initial troponin was negative in his lactic acid was initially normal.  The patient's oxygen supplementation was de-escalated to 2 L nasal cannula. When this was removed, the patient had his oxygen saturation drop into the 80s.  With this new oxygen requirement and uncertainty as to etiology of the respiratory distress, the decision was made to admit the patient to the hospitalist service for further respiratory monitoring. As the patient had improving respiratory status, the patient was able to report that he has had several days of congestion and rhinorrhea and cold-like symptoms. The patient denied any chest pain, palpitations, or productive cough. The patient reports he has never had these symptoms before and denies any new sources for possible anaphylaxis.  The patient was admitted in stable condition.  This patient was seen with Dr. Jodi Mourning, emergency medicine attending.    Final diagnoses:  Hypoxia      Theda Belfast, MD 08/21/15 [id number]  Blane Ohara, MD 08/21/15 1504

## 2015-08-20 NOTE — ED Notes (Signed)
Dr Gardiner Rhyme in room to exam pt's heart/lungs with ultrasound with 2 residents and this RN. Pt still tachypneic, SPO2 100% on continu21us neb. HR 90 sinus, RR 40, ETCO21

## 2015-08-20 NOTE — ED Notes (Signed)
Pt now able to slow breathing down more, pt able to speak in short sentences now. Pt has no hx of asthma per the group home worker, and no allergies, has not taken any new meds recently.

## 2015-08-20 NOTE — ED Notes (Addendum)
MD in room with this RN and Marian Sorrow, pt accompanied by worker from group home. Pt is autistic and began having difficulty breathing/sob approximately 1 hour pta. Vague story. Pt did have 1 episode of vomiting earlier.

## 2015-08-21 ENCOUNTER — Observation Stay (HOSPITAL_COMMUNITY): Payer: Medicaid Other

## 2015-08-21 ENCOUNTER — Encounter (HOSPITAL_COMMUNITY): Payer: Self-pay | Admitting: *Deleted

## 2015-08-21 DIAGNOSIS — J988 Other specified respiratory disorders: Secondary | ICD-10-CM | POA: Diagnosis not present

## 2015-08-21 DIAGNOSIS — T782XXA Anaphylactic shock, unspecified, initial encounter: Principal | ICD-10-CM | POA: Diagnosis present

## 2015-08-21 DIAGNOSIS — R0902 Hypoxemia: Secondary | ICD-10-CM | POA: Diagnosis not present

## 2015-08-21 LAB — I-STAT CG4 LACTIC ACID, ED: LACTIC ACID, VENOUS: 2.69 mmol/L — AB (ref 0.5–2.0)

## 2015-08-21 LAB — GLUCOSE, CAPILLARY
GLUCOSE-CAPILLARY: 140 mg/dL — AB (ref 65–99)
Glucose-Capillary: 121 mg/dL — ABNORMAL HIGH (ref 65–99)

## 2015-08-21 LAB — D-DIMER, QUANTITATIVE (NOT AT ARMC)

## 2015-08-21 LAB — TSH: TSH: 1.104 u[IU]/mL (ref 0.350–4.500)

## 2015-08-21 MED ORDER — PROPRANOLOL HCL ER BEADS 120 MG PO CP24: 120.0000 mg | ORAL_CAPSULE | Freq: Every day | ORAL | Status: DC

## 2015-08-21 MED ORDER — DEXAMETHASONE SODIUM PHOSPHATE 4 MG/ML IJ SOLN
6.0000 mg | Freq: Four times a day (QID) | INTRAMUSCULAR | Status: DC
  Administered 2015-08-21 – 2015-08-22 (×4): 6 mg via INTRAVENOUS
  Filled 2015-08-21 (×4): qty 2

## 2015-08-21 MED ORDER — PREDNISONE 50 MG PO TABS
50.0000 mg | ORAL_TABLET | Freq: Every day | ORAL | Status: DC
  Administered 2015-08-21: 50 mg via ORAL
  Filled 2015-08-21: qty 1

## 2015-08-21 MED ORDER — TRAZODONE HCL 50 MG PO TABS
50.0000 mg | ORAL_TABLET | Freq: Every day | ORAL | Status: DC
  Administered 2015-08-21 – 2015-08-23 (×4): 50 mg via ORAL
  Filled 2015-08-21 (×4): qty 1

## 2015-08-21 MED ORDER — ARIPIPRAZOLE 15 MG PO TABS
15.0000 mg | ORAL_TABLET | Freq: Every day | ORAL | Status: DC
  Administered 2015-08-21 – 2015-08-24 (×4): 15 mg via ORAL
  Filled 2015-08-21 (×4): qty 1

## 2015-08-21 MED ORDER — ACETAMINOPHEN 325 MG PO TABS
650.0000 mg | ORAL_TABLET | Freq: Four times a day (QID) | ORAL | Status: DC | PRN
  Administered 2015-08-21: 650 mg via ORAL
  Filled 2015-08-21: qty 2

## 2015-08-21 MED ORDER — GABAPENTIN 600 MG PO TABS
1200.0000 mg | ORAL_TABLET | Freq: Every day | ORAL | Status: DC
  Administered 2015-08-21 – 2015-08-23 (×4): 1200 mg via ORAL
  Filled 2015-08-21 (×5): qty 2

## 2015-08-21 MED ORDER — CETYLPYRIDINIUM CHLORIDE 0.05 % MT LIQD
7.0000 mL | Freq: Two times a day (BID) | OROMUCOSAL | Status: DC
Start: 2015-08-21 — End: 2015-08-24
  Administered 2015-08-21 – 2015-08-23 (×6): 7 mL via OROMUCOSAL

## 2015-08-21 MED ORDER — ALUM & MAG HYDROXIDE-SIMETH 200-200-20 MG/5ML PO SUSP
15.0000 mL | ORAL | Status: DC | PRN
  Administered 2015-08-21 (×2): 15 mL via ORAL
  Filled 2015-08-21 (×2): qty 30

## 2015-08-21 MED ORDER — IPRATROPIUM-ALBUTEROL 0.5-2.5 (3) MG/3ML IN SOLN
3.0000 mL | Freq: Four times a day (QID) | RESPIRATORY_TRACT | Status: DC | PRN
  Administered 2015-08-21: 3 mL via RESPIRATORY_TRACT
  Filled 2015-08-21: qty 3

## 2015-08-21 MED ORDER — HEPARIN SODIUM (PORCINE) 5000 UNIT/ML IJ SOLN
5000.0000 [IU] | Freq: Three times a day (TID) | INTRAMUSCULAR | Status: DC
  Administered 2015-08-21 – 2015-08-24 (×9): 5000 [IU] via SUBCUTANEOUS
  Filled 2015-08-21 (×10): qty 1

## 2015-08-21 MED ORDER — ALBUTEROL SULFATE (2.5 MG/3ML) 0.083% IN NEBU
2.5000 mg | INHALATION_SOLUTION | Freq: Once | RESPIRATORY_TRACT | Status: AC
  Administered 2015-08-21: 2.5 mg via RESPIRATORY_TRACT
  Filled 2015-08-21: qty 3

## 2015-08-21 MED ORDER — DIPHENHYDRAMINE HCL 50 MG/ML IJ SOLN
12.5000 mg | Freq: Once | INTRAMUSCULAR | Status: AC
Start: 2015-08-21 — End: 2015-08-21
  Administered 2015-08-21: 12.5 mg via INTRAVENOUS
  Filled 2015-08-21: qty 1

## 2015-08-21 MED ORDER — PANTOPRAZOLE SODIUM 40 MG IV SOLR
40.0000 mg | Freq: Two times a day (BID) | INTRAVENOUS | Status: DC
  Administered 2015-08-21 – 2015-08-23 (×4): 40 mg via INTRAVENOUS
  Filled 2015-08-21 (×4): qty 40

## 2015-08-21 MED ORDER — CARBAMAZEPINE 200 MG PO TABS
200.0000 mg | ORAL_TABLET | Freq: Two times a day (BID) | ORAL | Status: DC
  Administered 2015-08-21 – 2015-08-24 (×8): 200 mg via ORAL
  Filled 2015-08-21 (×9): qty 1

## 2015-08-21 MED ORDER — PANTOPRAZOLE SODIUM 40 MG IV SOLR: 40.0000 mg | INTRAVENOUS | Status: DC

## 2015-08-21 MED ORDER — DIVALPROEX SODIUM 500 MG PO DR TAB
1000.0000 mg | DELAYED_RELEASE_TABLET | Freq: Every day | ORAL | Status: DC
  Administered 2015-08-21 – 2015-08-23 (×4): 1000 mg via ORAL
  Filled 2015-08-21 (×4): qty 2
  Filled 2015-08-21: qty 4
  Filled 2015-08-21: qty 2

## 2015-08-21 MED ORDER — IOHEXOL 300 MG/ML  SOLN
75.0000 mL | Freq: Once | INTRAMUSCULAR | Status: AC | PRN
  Administered 2015-08-21: 75 mL via INTRAVENOUS

## 2015-08-21 MED ORDER — CLONAZEPAM 0.5 MG PO TABS
0.5000 mg | ORAL_TABLET | Freq: Every evening | ORAL | Status: DC | PRN
  Administered 2015-08-24: 0.5 mg via ORAL
  Filled 2015-08-21: qty 1

## 2015-08-21 MED ORDER — INSULIN ASPART 100 UNIT/ML ~~LOC~~ SOLN
0.0000 [IU] | Freq: Three times a day (TID) | SUBCUTANEOUS | Status: DC
  Administered 2015-08-21: 1 [IU] via SUBCUTANEOUS
  Administered 2015-08-22: 2 [IU] via SUBCUTANEOUS
  Administered 2015-08-22 – 2015-08-23 (×3): 1 [IU] via SUBCUTANEOUS
  Administered 2015-08-23: 2 [IU] via SUBCUTANEOUS
  Administered 2015-08-23: 1 [IU] via SUBCUTANEOUS

## 2015-08-21 NOTE — Social Work (Signed)
CSW spoke with Mr. Sheryn Bison [phone] who is the group home worker. He stated that he will provide transportation and will need AVS and dc summary once patient is ready for discharge.   Beverly Sessions MSW, LCSW [phone]

## 2015-08-21 NOTE — Progress Notes (Signed)
   [facility]                                                                              Patient Demographics  Garrett Shaffer, is a 29 y.o. male, DOB - [birth year], WUJ:[id number]  Admit date - 08/20/2015   Admitting Physician Hillary Bow, DO  Outpatient Primary MD for the patient is [facility], MD  LOS -    Chief Complaint  Patient presents with  . Shortness of Breath      HPI on 08/21/2015 by Dr. Lyda Perone Garrett Shaffer is a 29 y.o. male with h/o MR, psych disorders, seizures, but no other medical issues chronically. Patient presents to ED this evening with sudden onset of SOB, wheezing. Symptoms onset 1 hr PTA. Per caregiver no new foods, contacts, etc that explain the apparent allergic reaction. After being given epinephrine, his breathing is significantly improved.  Assessment & Plan   Patient admitted earlier this morning by Dr. Lyda Perone. Please see for H&P for details. Agree with current assessment and plan.  Anaphylactic reaction/ acute respiratory distress with hypoxia -Patient does not have history of asthma or COPD, never smoker -upon arrival to ED, patient was found to have significant wheezing, stridor, tripoding and tachypnea. Patient was given racemic epinephrine. -d-dimer negative -chest x-ray and lateral neck x-ray unremarkable -Unknown etiology at this time, will continue monitor patient -Continue prednisone  Bipolar disorder/Seizure disorder -Continue abilify and tegretol, depokote  Leukocytosis -Likely reactive -Continue to monitor CBC -No source of infection  Code Status: Full  Family Communication:  None at bedside  Disposition Plan: Admitted for observation  Time Spent in minutes   30 minutes  Procedures  Bedside Echocardiogram by EDP: No abnormality noted Bedside Ultrasound- by EDP: No PTX  Consults   None  DVT Prophylaxis  Heparin  Domanique Huesman D.O. on 08/21/2015 at 12:41 PM  Between 7am to 7pm - Pager  - [phone]  After 7pm go to [email or web address] - password [email or web address]  And look for the night coverage person covering for me after hours  [facility] Office  [phone]

## 2015-08-21 NOTE — Consult Note (Signed)
Name: BON DOWIS MRN: [id number] DOB: 05/10/1986    ADMISSION DATE:  08/20/2015 CONSULTATION DATE:  10/8  REFERRING MD :  Catha Gosselin  CHIEF COMPLAINT:  wheeze  BRIEF PATIENT DESCRIPTION:  29 year old male lives at a group home. Has complex history of mental retardation, as well as psychiatric disorder. Admitted initially with a working diagnosis of an allergic reaction of unknown etiology on 10/7 was treated initially with racemic epinephrine, and systemic intramuscular epinephrine with notable improvement per medical team. Was admitted for further monitoring, pulmonary asked to evaluate on 10/8 as patient found to have significant upper airway wheezing  SIGNIFICANT EVENTS    STUDIES:   10/7: Soft tissue neck: No acute obstruction 10/8: Soft tissue neck CT>>>   HISTORY OF PRESENT ILLNESS:   29 year old male patient with history of mental retardation, complex psychiatric disorder, and seizures. He resides at a group home.he presented to the emergency room on 10/7 with sudden onset of shortness of breath and wheezing which initiated one hour prior to presentation. Per his caregivers he had had no new contacts, no new foods, no new medications, or any new or unexpected inhaled exposures.in the emergency room he was treated with racemic epinephrine, as well as epinephrine systemically via intramuscular injection. He was treated in the emergency room with this, his symptoms improved.he was admitted to the hospital service for further monitoring.on 10 8 he was seen in Gen. Medical Rounds noted to have significant upper airway wheezing, pulmonary was asked to evaluate.  PAST MEDICAL HISTORY :   has a past medical history of Mental retardation; Bipolar 1 disorder (HCC); Intermittent explosive disorder; Cerebral palsy (HCC); and Seizures (HCC).  has no past surgical history on file. Prior to Admission medications   Medication Sig Start Date End Date Taking? Authorizing Provider  ARIPiprazole  (ABILIFY) 15 MG tablet Take 15 mg by mouth daily.     Yes Historical Provider, MD  carbamazepine (TEGRETOL) 200 MG tablet Take 200 mg by mouth 2 (two) times daily.     Yes Historical Provider, MD  clonazePAM (KLONOPIN) 0.5 MG tablet Take 0.5 mg by mouth at bedtime as needed. FOR SLEEP   Yes Historical Provider, MD  divalproex (DEPAKOTE) 500 MG DR tablet Take 1,000 mg by mouth at bedtime.    Yes Historical Provider, MD  gabapentin (NEURONTIN) 600 MG tablet Take 1,200 mg by mouth at bedtime.    Yes Historical Provider, MD  propranolol (INNOPRAN XL) 120 MG 24 hr capsule Take 120 mg by mouth daily.    Yes Historical Provider, MD  traZODone (DESYREL) 50 MG tablet Take 50 mg by mouth at bedtime.   Yes Historical Provider, MD   No Known Allergies  FAMILY HISTORY:  family history is not on file. SOCIAL HISTORY:  reports that he has never smoked. He has never used smokeless tobacco. He reports that he does not drink alcohol or use illicit drugs.  REVIEW OF SYSTEMS:   Unable to provide significant review of systems, he's currently preoccupied with his nook. When asked specifically he does endorse heartburn/reflux. He is unable to give me more history than this  SUBJECTIVE:  No distress VITAL SIGNS: Temp:  [98.6 F (37 C)-99.4 F (37.4 C)] 98.6 F (37 C) (10/08 1330) Pulse Rate:  [72-96] 81 (10/08 1330) Resp:  [18-30] 20 (10/08 1330) BP: (105-140)/(53-94) 124/53 mmHg (10/08 1330) SpO2:  [93 %-100 %] 98 % (10/08 1435) Weight:  [92.625 kg (204 lb 3.2 oz)] 92.625 kg (204 lb 3.2 oz) (10/08  29)  PHYSICAL EXAMINATION: General:  29 year old male currently resting in bed Neuro:  Awake, oriented, at his normal baseline cognition HEENT:  Normocephalic, atraumatic, marked upper airway wheezing. Posterior pharynx is non-erythemic, and no edema. Cardiovascular:  Regular rate and rhythm Lungs:  Clear to auscultation Abdomen:  Soft nontender Musculoskeletal:  intact Skin:  intact   Recent Labs Lab  08/20/15 2121  NA 133*  K 4.2  CL 99*  CO2 24  BUN 6  CREATININE 0.71  GLUCOSE 125*    Recent Labs Lab 08/20/15 2121  HGB 13.6  HCT 42.0  WBC 12.5*  PLT 237   Dg Neck Soft Tissue  08/20/2015   CLINICAL DATA:  Dyspnea and cough for 1 day.  EXAM: NECK SOFT TISSUES - 1+ VIEW  COMPARISON:  None.  FINDINGS: There is no evidence of retropharyngeal soft tissue swelling or epiglottic enlargement. The cervical airway is unremarkable and no radio-opaque foreign body identified.  IMPRESSION: Negative.   Electronically Signed   By: Ellery Plunk M.D.   On: 08/20/2015 21:41   Dg Chest Portable 1 View  08/20/2015   CLINICAL DATA:  Severe shortness of breath.  Diaphoresis.  EXAM: PORTABLE CHEST 1 VIEW  COMPARISON:  06/19/2010  FINDINGS: Stents of artifact overlies the chest. Heart size is normal. Mediastinal shadows are normal. The lungs are clear. No effusions. No bony abnormality. Distention of the stomach.  IMPRESSION: Extensive overlying artifact.  No active disease identified.   Electronically Signed   By: Paulina Fusi M.D.   On: 08/20/2015 21:36    ASSESSMENT / PLAN:  Dyspnea with prominent upper airway wheeze. Favor of laryngopharyngeal reflux disease/vocal cord dysfunction, however need to rule out upper airway obstruction. There is no stridor on exam, this is all expiratory wheezing which would favor LPR/VCD. These are often multi-factorial. Reflux, nasal drainage, recent URI, and anxiety/psychiatric disorders heavily contributing factors. Stiff difficult to say whether or not his initial presentation was simply this, or true allergic reaction. Plan CT neck rule out obstruction Change Solu-Medrol to Decadron, however if no evidence of obstruction, suspect we can go ahead and discontinue altogether Will add Protonix, and administer twice a day Would benefit from swallow evaluation Additionally would consider esophagram to assess for significant reflux assuming CT neck is  negative.  Simonne Martinet ACNP-BC [facility] Pager # [phone] OR # [phone] if no answer   08/21/2015, 4:29 PM  STAFF NOTE: I, Rory Percy, MD FACP have personally reviewed patient's available data, including medical history, events of note, physical examination and test results as part of my evaluation. I have discussed with resident/NP and other care providers such as pharmacist, RN and RRT. In addition, I personally evaluated patient and elicited key findings of:  This is upper airway, concern is vocal cord dysfxn, he is in no distress, the vocal wheezing is radiating to lungs and no primary wheezing from lungs, at age 64 and underlying medical do, would CT neck, assess tsh, add decadron for now (may not benefit), dc pred, he denied trying to swallow foreign objects, dc any antichol nebs, may irritate upper airway, slp needed to r/o aspiration causing upper airway, will follow   Mcarthur Rossetti. Tyson Alias, MD, FACP Pgr: [phone] [facility] 08/21/2015 5:11 PM

## 2015-08-21 NOTE — H&P (Signed)
[facility] History and Physical  Garrett Shaffer ZOX:[id number] DOB: 1986/09/18 DOA: 08/20/2015  Referring physician: EDP PCP: Julieanne Manson, MD   Chief Complaint: Respiratory distress   HPI: Garrett Shaffer is a 29 y.o. male with h/o MR, psych disorders, seizures, but no other medical issues chronically.  Patient presents to ED this evening with sudden onset of SOB, wheezing.  Symptoms onset 1 hr PTA.  Per caregiver no new foods, contacts, etc that explain the apparent allergic reaction.  After being given epinephrine, his breathing is significantly improved.  Review of Systems: Systems reviewed.  As above, otherwise negative  Past Medical History  Diagnosis Date  . Mental retardation   . Bipolar 1 disorder (HCC)   . Intermittent explosive disorder   . Cerebral palsy (HCC)   . Seizures (HCC)    History reviewed. No pertinent past surgical history. Social History:  reports that he has never smoked. He has never used smokeless tobacco. He reports that he does not drink alcohol or use illicit drugs.  No Known Allergies  No family history on file.   Prior to Admission medications   Medication Sig Start Date End Date Taking? Authorizing Provider  ARIPiprazole (ABILIFY) 15 MG tablet Take 15 mg by mouth daily.     Yes Historical Provider, MD  carbamazepine (TEGRETOL) 200 MG tablet Take 200 mg by mouth 2 (two) times daily.     Yes Historical Provider, MD  clonazePAM (KLONOPIN) 0.5 MG tablet Take 0.5 mg by mouth at bedtime as needed. FOR SLEEP   Yes Historical Provider, MD  divalproex (DEPAKOTE) 500 MG DR tablet Take 1,000 mg by mouth at bedtime.    Yes Historical Provider, MD  gabapentin (NEURONTIN) 600 MG tablet Take 1,200 mg by mouth at bedtime.    Yes Historical Provider, MD  propranolol (INNOPRAN XL) 120 MG 24 hr capsule Take 120 mg by mouth daily.    Yes Historical Provider, MD  traZODone (DESYREL) 50 MG tablet Take 50 mg by mouth at bedtime.   Yes Historical Provider, MD    Physical Exam: Filed Vitals:   08/21/15 0030  BP: 130/83  Pulse: 87  Resp: 27    BP 130/83 mmHg  Pulse 87  Temp(Src)   Resp 27  SpO2 96%  General Appearance:    Alert, oriented, no distress, appears stated age  Head:    Normocephalic, atraumatic  Eyes:    PERRL, EOMI, sclera non-icteric        Nose:   Nares without drainage or epistaxis. Mucosa, turbinates normal  Throat:   Moist mucous membranes. Oropharynx without erythema or exudate.  Neck:   Supple. No carotid bruits.  No thyromegaly.  No lymphadenopathy.   Back:     No CVA tenderness, no spinal tenderness  Lungs:     Clear to auscultation bilaterally, without wheezes, rhonchi or rales  Chest wall:    No tenderness to palpitation  Heart:    Regular rate and rhythm without murmurs, gallops, rubs  Abdomen:     Soft, non-tender, nondistended, normal bowel sounds, no organomegaly  Genitalia:    deferred  Rectal:    deferred  Extremities:   No clubbing, cyanosis or edema.  Pulses:   2+ and symmetric all extremities  Skin:   Skin color, texture, turgor normal, no rashes or lesions  Lymph nodes:   Cervical, supraclavicular, and axillary nodes normal  Neurologic:   CNII-XII intact. Normal strength, sensation and reflexes      throughout  Labs on Admission:  Basic Metabolic Panel:  Recent Labs Lab 08/20/15 2121  NA 133*  K 4.2  CL 99*  CO2 24  GLUCOSE 125*  BUN 6  CREATININE 0.71  CALCIUM 8.9   Liver Function Tests: No results for input(s): AST, ALT, ALKPHOS, BILITOT, PROT, ALBUMIN in the last 168 hours. No results for input(s): LIPASE, AMYLASE in the last 168 hours. No results for input(s): AMMONIA in the last 168 hours. CBC:  Recent Labs Lab 08/20/15 2121  WBC 12.5*  NEUTROABS 7.4  HGB 13.6  HCT 42.0  MCV 83.8  PLT 237   Cardiac Enzymes: No results for input(s): CKTOTAL, CKMB, CKMBINDEX, TROPONINI in the last 168 hours.  BNP (last 3 results) No results for input(s): PROBNP in the last 8760  hours. CBG: No results for input(s): GLUCAP in the last 168 hours.  Radiological Exams on Admission: Dg Neck Soft Tissue  08/20/2015   CLINICAL DATA:  Dyspnea and cough for 1 day.  EXAM: NECK SOFT TISSUES - 1+ VIEW  COMPARISON:  None.  FINDINGS: There is no evidence of retropharyngeal soft tissue swelling or epiglottic enlargement. The cervical airway is unremarkable and no radio-opaque foreign body identified.  IMPRESSION: Negative.   Electronically Signed   By: Ellery Plunk M.D.   On: 08/20/2015 21:41   Dg Chest Portable 1 View  08/20/2015   CLINICAL DATA:  Severe shortness of breath.  Diaphoresis.  EXAM: PORTABLE CHEST 1 VIEW  COMPARISON:  06/19/2010  FINDINGS: Stents of artifact overlies the chest. Heart size is normal. Mediastinal shadows are normal. The lungs are clear. No effusions. No bony abnormality. Distention of the stomach.  IMPRESSION: Extensive overlying artifact.  No active disease identified.   Electronically Signed   By: Paulina Fusi M.D.   On: 08/20/2015 21:36    EKG: Independently reviewed.  Assessment/Plan Principal Problem:   Anaphylactic reaction Active Problems:   Hypoxia   1. Ananaphylactic reaction with hypoxia - patient has no h/o asthma, no h/o COPD, never smoker, and is already demonstrating more rapid improvement than one would expect from most other severe acute onset respiratory syndromes. 1. O2 via [place] 2. Continue neb treatments per adult wheeze protocol 3. Have ordered prednisone 4. Thankfully it appears that for the moment patient has stabilized, given how close he came to intubation, i agree that overnight obs is reasonable. 5. Biggest concern as I expressed to the caregiver though is that we dont know what allergen exposure caused this and thus we dont know what to avoid.   Code Status: Full Code  Family Communication: Caregiver at bedside Disposition Plan: Admit to obs   Time spent: 70 min  Lyndsey Demos M. [facility] Pager  [phone]  If 7AM-7PM, please contact the day team taking care of the patient [email or web address] Password [email or web address] 08/21/2015, 12:36 AM

## 2015-08-22 DIAGNOSIS — D72829 Elevated white blood cell count, unspecified: Secondary | ICD-10-CM

## 2015-08-22 DIAGNOSIS — T782XXA Anaphylactic shock, unspecified, initial encounter: Secondary | ICD-10-CM | POA: Diagnosis present

## 2015-08-22 DIAGNOSIS — F3162 Bipolar disorder, current episode mixed, moderate: Secondary | ICD-10-CM | POA: Diagnosis not present

## 2015-08-22 DIAGNOSIS — F79 Unspecified intellectual disabilities: Secondary | ICD-10-CM | POA: Diagnosis present

## 2015-08-22 DIAGNOSIS — F319 Bipolar disorder, unspecified: Secondary | ICD-10-CM | POA: Diagnosis present

## 2015-08-22 DIAGNOSIS — G40909 Epilepsy, unspecified, not intractable, without status epilepticus: Secondary | ICD-10-CM | POA: Diagnosis present

## 2015-08-22 DIAGNOSIS — G809 Cerebral palsy, unspecified: Secondary | ICD-10-CM | POA: Diagnosis present

## 2015-08-22 DIAGNOSIS — E872 Acidosis: Secondary | ICD-10-CM | POA: Diagnosis present

## 2015-08-22 DIAGNOSIS — F6381 Intermittent explosive disorder: Secondary | ICD-10-CM | POA: Diagnosis present

## 2015-08-22 DIAGNOSIS — R0902 Hypoxemia: Secondary | ICD-10-CM | POA: Diagnosis present

## 2015-08-22 DIAGNOSIS — R0600 Dyspnea, unspecified: Secondary | ICD-10-CM | POA: Diagnosis not present

## 2015-08-22 DIAGNOSIS — R061 Stridor: Secondary | ICD-10-CM | POA: Diagnosis present

## 2015-08-22 DIAGNOSIS — K219 Gastro-esophageal reflux disease without esophagitis: Secondary | ICD-10-CM | POA: Diagnosis present

## 2015-08-22 DIAGNOSIS — J988 Other specified respiratory disorders: Secondary | ICD-10-CM | POA: Diagnosis not present

## 2015-08-22 LAB — CBC
HCT: 40.5 % (ref 39.0–52.0)
Hemoglobin: 13.1 g/dL (ref 13.0–17.0)
MCH: 27 pg (ref 26.0–34.0)
MCHC: 32.3 g/dL (ref 30.0–36.0)
MCV: 83.3 fL (ref 78.0–100.0)
PLATELETS: 221 10*3/uL (ref 150–400)
RBC: 4.86 MIL/uL (ref 4.22–5.81)
RDW: 13.8 % (ref 11.5–15.5)
WBC: 9.8 10*3/uL (ref 4.0–10.5)

## 2015-08-22 LAB — GLUCOSE, CAPILLARY
GLUCOSE-CAPILLARY: 153 mg/dL — AB (ref 65–99)
GLUCOSE-CAPILLARY: 168 mg/dL — AB (ref 65–99)
Glucose-Capillary: 140 mg/dL — ABNORMAL HIGH (ref 65–99)
Glucose-Capillary: 135 mg/dL — ABNORMAL HIGH (ref 65–99)

## 2015-08-22 LAB — BASIC METABOLIC PANEL
ANION GAP: 10 (ref 5–15)
BUN: 8 mg/dL (ref 6–20)
CO2: 26 mmol/L (ref 22–32)
Calcium: 9.3 mg/dL (ref 8.9–10.3)
Chloride: 96 mmol/L — ABNORMAL LOW (ref 101–111)
Creatinine, Ser: 0.68 mg/dL (ref 0.61–1.24)
GFR calc Af Amer: >60 mL/min (ref >60)
GFR calc non Af Amer: >60 mL/min (ref >60)
GLUCOSE: 149 mg/dL — AB (ref 65–99)
POTASSIUM: 4.5 mmol/L (ref 3.5–5.1)
Sodium: 132 mmol/L — ABNORMAL LOW (ref 135–145)

## 2015-08-22 LAB — MRSA PCR SCREENING: MRSA BY PCR: NEGATIVE

## 2015-08-22 MED ORDER — INFLUENZA VAC SPLIT QUAD 0.5 ML IM SUSY
0.5000 mL | PREFILLED_SYRINGE | Freq: Once | INTRAMUSCULAR | Status: AC
  Administered 2015-08-22: 0.5 mL via INTRAMUSCULAR
  Filled 2015-08-22: qty 0.5

## 2015-08-22 MED ORDER — RACEPINEPHRINE HCL 2.25 % IN NEBU
0.5000 mL | INHALATION_SOLUTION | Freq: Once | RESPIRATORY_TRACT | Status: AC
  Administered 2015-08-22: 0.5 mL via RESPIRATORY_TRACT
  Filled 2015-08-22: qty 0.5

## 2015-08-22 MED ORDER — DEXAMETHASONE SODIUM PHOSPHATE 4 MG/ML IJ SOLN: 4.0000 mg | Freq: Two times a day (BID) | INTRAMUSCULAR | Status: DC

## 2015-08-22 MED ORDER — PANTOPRAZOLE SODIUM 40 MG PO TBEC: 40.0000 mg | DELAYED_RELEASE_TABLET | Freq: Every day | ORAL | Status: DC

## 2015-08-22 MED ORDER — INFLUENZA VAC SPLIT QUAD 0.5 ML IM SUSY: 0.5000 mL | PREFILLED_SYRINGE | INTRAMUSCULAR | Status: DC

## 2015-08-22 MED ORDER — DEXAMETHASONE SODIUM PHOSPHATE 4 MG/ML IJ SOLN
6.0000 mg | Freq: Four times a day (QID) | INTRAMUSCULAR | Status: DC
  Administered 2015-08-22 – 2015-08-23 (×4): 6 mg via INTRAVENOUS
  Filled 2015-08-22 (×4): qty 2

## 2015-08-22 NOTE — Progress Notes (Signed)
Received patient from [address] with c/o stridor. Breathing treatment given by Respiratory therapy after treatment left on room air @ 94%.

## 2015-08-22 NOTE — Discharge Instructions (Signed)
Acute Respiratory Distress Syndrome °Acute respiratory distress syndrome is a life-threatening condition in which fluid collects in the lungs. This condition can cause severe shortness of breath and low oxygen levels in the blood. It can also cause the lungs and other vital organs to fail. The condition usually develops within 24 to 48 hours of an infection, illness, surgery, or injury. °CAUSES °This condition may be caused by: °· An infection, such as sepsis or pneumonia. °· A serious injury to the head or chest. °· A major surgery. °· A drug overdose. °· Breathing in harmful chemicals or smoke. °· Blood transfusions. °· A blood clot in the lungs. °SYMPTOMS °Symptoms of this condition include: °· Fever. °· Difficulty breathing. °· Bluish skin (cyanosis). °· A fast or irregular heartbeat. °· Low blood pressure (hypotension). °· Agitation. °· Confusion. °· Lack of energy (lethargy). °· Sweating. °DIAGNOSIS °This condition may be diagnosed by using tests to rule out other diseases and conditions that cause similar symptoms. You may have: °· A chest X-ray or CT scan. °· Blood tests. °· A sputum culture. In this test, you will be asked to spit so that a sample of lung fluid can be taken for testing. °· Bronchoscopy. During this test a thin, flexible tool is passed into the mouth or nose, down the windpipe, and into the lungs. °TREATMENT °This condition may be treated with: °· Oxygen. A breathing machine (ventilator) is often used to provide oxygen and help with breathing. °· Medicine to help you relax (sedative). °· Fluids and nutrients given through an IV tube. °· Blood pressure medicine. °· Steroid medicine to help decrease inflammation in the lungs. °· Diuretic medicine to get rid of extra fluid in the body. °Additional treatment may be needed, depending on the cause of the condition. It can take up to 12 months to recover from this condition. Some people recover fully, but others may continue to  have: °· Weakness. °· Shortness of breath. °· Memory problems. °· Depression. °HOME CARE INSTRUCTIONS °Until you recover from this condition: °· Do not smoke. °· Limit alcohol intake to no more than 1 drink per day for nonpregnant women and 2 drinks per day for men. One drink equals 12 oz of beer, 5 oz of wine, or 1½ oz of hard liquor. °· Ask friends and family to help you if daily activities make you tired. °SEEK MEDICAL CARE IF: °· You become short of breath with activity or while resting. °· You develop a cough that does not go away. °· You have a fever. °SEEK IMMEDIATE MEDICAL CARE IF: °· You have sudden shortness of breath. °· You develop chest pain that does not go away. °· You develop swelling or pain in one of your legs. °· You cough up blood. °  °This information is not intended to replace advice given to you by your health care provider. Make sure you discuss any questions you have with your health care provider. °  °Document Released: 10/30/2005 Document Revised: 03/16/2015 Document Reviewed: 10/26/2014 °Elsevier Interactive Patient Education ©2016 [employer] ° °

## 2015-08-22 NOTE — Progress Notes (Signed)
BSE completed.  Pt presents with negative CN exam nor overt indications of airway compromise with intake. Family present and reports pt coughing over the weekend, no worse with po intake.  Pt does admit to issues with reflux symptoms over the weekend and given possible VCD/LPR, recommend consider medical treatment.  Family educated to assure pt eats slowly - as he impulsive and eats at rapid rate.  Thanks for this consult. Full report to follow.   Donavan Burnet, MS [facility] SLP [phone]

## 2015-08-22 NOTE — Progress Notes (Addendum)
[facility]                                                                              Patient Demographics  Garrett Shaffer, is a 29 y.o. male, DOB - [birth year], ZDG:[id number]  Admit date - 08/20/2015   Admitting Physician Hillary Bow, DO  Outpatient Primary MD for the patient is [facility], MD  LOS -    Chief Complaint  Patient presents with  . Shortness of Breath        HPI on 08/21/2015 by Dr. Lyda Perone Garrett MUSCATELLO is a 29 y.o. male with h/o MR, psych disorders, seizures, but no other medical issues chronically. Patient presents to ED this evening with sudden onset of SOB, wheezing. Symptoms onset 1 hr PTA. Per caregiver no new foods, contacts, etc that explain the apparent allergic reaction. After being given epinephrine, his breathing is significantly improved.   Assessment & Plan   Acute respiratory distress with hypoxia/wheezing -Thought to be an anaphylactic reaction, however, unknown cause -Patient does not have history of asthma or COPD, never smoker -upon arrival to ED, patient was found to have significant wheezing, stridor, tripoding and tachypnea. Patient was given racemic epinephrine. -d-dimer negative -chest x-ray and lateral neck x-ray unremarkable -PCCM consulted and appreciated, felt this could be vocal cord dysfunction -CT soft tissue neck: No evidence of soft tissue abnormality, airway patent -TSH 1.104 -Patient initially placed on prednisone, but switched to decadron IV q6, and tapered to q12hrs -Speech evaluated patient, recommended regular solid foods and liquids, slow rate with small sips and bites. Patient was not found to have any overt indications of airway compromise or intake -Oxygen saturations upon room air with ambulation 95% -Spoke with Dr. Tyson Alias, recommended ENT be consulted and observe patient for another day.  Bipolar disorder/Seizure disorder -Continue abilify and tegretol,  depokote  Leukocytosis -Resolved, Likely reactive -Continue to monitor CBC -No source of infection  GERD -Continue PPI  Lactic acidosis -Likely secondary to respiratory status -Will continue to monitor  Code Status: Full  Family Communication: Caregivers at bedside  Disposition Plan: Admitted for observation  Time Spent in minutes 30 minutes  Procedures  Bedside Echocardiogram by EDP: No abnormality noted Bedside Ultrasound- by EDP: No PTX  Consults  None  DVT Prophylaxis Heparin  Lab Results  Component Value Date   PLT 221 08/22/2015    Medications  Scheduled Meds: . antiseptic oral rinse  7 mL Mouth Rinse BID  . ARIPiprazole  15 mg Oral Daily  . carbamazepine  200 mg Oral BID  . dexamethasone  4 mg Intravenous Q12H  . divalproex  1,000 mg Oral QHS  . gabapentin  1,200 mg Oral QHS  . heparin  5,000 Units Subcutaneous 3 times per day  . insulin aspart  0-9 Units Subcutaneous TID WC  . pantoprazole (PROTONIX) IV  40 mg Intravenous Q12H  . traZODone  50 mg Oral QHS   Continuous Infusions:  PRN Meds:.acetaminophen, alum & mag hydroxide-simeth, clonazePAM  Antibiotics    Anti-infectives    None      Subjective:   [name] seen and examined today.  Patient states he is having indigestion.  Denis chest  pain.  Still feels short of breath and gets anxious.     Objective:   Filed Vitals:   08/21/15 2021 08/21/15 2044 08/22/15 0607 08/22/15 1439  BP:  137/62 131/63 120/61  Pulse: 80 87 68 72  Temp:  98.2 F (36.8 C) 98 F (36.7 C) 97.8 F (36.6 C)  TempSrc:  Oral Oral Oral  Resp: Height:      Weight:      SpO2: 97% 95% 99% 97%    Wt Readings from Last 3 Encounters:  08/21/15 92.625 kg (204 lb 3.2 oz)  08/13/09 80.151 kg (176 lb 11.2 oz)  05/22/08 78.472 kg (173 lb)    No intake or output data in the 24 hours ending 08/22/15 1633  Exam  General: Well developed, well nourished, NAD  HEENT: NCAT, mucous membranes  moist.  Neck: Supple, no JVD, no masses  Cardiovascular: S1 S2 auscultated, no rubs, murmurs or gallops. Regular rate and rhythm.  Respiratory: Clear to auscultation bilaterally. Upper airway wheezing anteriorly  Abdomen: Soft, obese, nontender, nondistended, + bowel sounds  Extremities: warm dry without cyanosis clubbing or edema  Neuro: AAOx3, Nonfocal (does have MR- but at baseline)  Psych: Appropriate mood and affect  Data Review   Micro Results No results found for this or any previous visit (from the past 240 hour(s)).  Radiology Reports Dg Neck Soft Tissue  08/20/2015   CLINICAL DATA:  Dyspnea and cough for 1 day.  EXAM: NECK SOFT TISSUES - 1+ VIEW  COMPARISON:  None.  FINDINGS: There is no evidence of retropharyngeal soft tissue swelling or epiglottic enlargement. The cervical airway is unremarkable and no radio-opaque foreign body identified.  IMPRESSION: Negative.   Electronically Signed   By: Ellery Plunk M.D.   On: 08/20/2015 21:41   Ct Soft Tissue Neck W Contrast  08/21/2015   CLINICAL DATA:  Upper airway wheezing.  EXAM: CT NECK WITH CONTRAST  TECHNIQUE: Multidetector CT imaging of the neck was performed using the standard protocol following the bolus administration of intravenous contrast.  CONTRAST:  75mL OMNIPAQUE IOHEXOL 300 MG/ML  SOLN  COMPARISON:  None.  FINDINGS: There is no lymphadenopathy. There is no nasopharyngeal or oropharyngeal mass. There is no focal fluid collection. The airway is patent. The visualized portions of the carotid arteries and internal jugular veins are patent.  The visualized portions of the brain demonstrate no focal abnormality.  There is significant polypoid mucosal thickening of bilateral maxillary sinuses and ethmoid air cells. The mastoid air cells are normally aerated.  The lung apices are clear.  There is no lytic or blastic osseous lesion.  IMPRESSION: No evidence of soft tissue abnormality within the neck. The airway is patent.   Bilateral maxillary sinuses and ethmoid air cells polypoid mucosal thickening.   Electronically Signed   By: Ted Mcalpine M.D.   On: 08/21/2015 18:54   Dg Chest Portable 1 View  08/20/2015   CLINICAL DATA:  Severe shortness of breath.  Diaphoresis.  EXAM: PORTABLE CHEST 1 VIEW  COMPARISON:  06/19/2010  FINDINGS: Stents of artifact overlies the chest. Heart size is normal. Mediastinal shadows are normal. The lungs are clear. No effusions. No bony abnormality. Distention of the stomach.  IMPRESSION: Extensive overlying artifact.  No active disease identified.   Electronically Signed   By: Paulina Fusi M.D.   On: 08/20/2015 21:36    CBC  Recent Labs Lab 08/20/15 2121 08/22/15 0553  WBC 12.5* 9.8  HGB 13.6  13.1  HCT 42.0 40.5  PLT 237 221  MCV 83.8 83.3  MCH 27.1 27.0  MCHC 32.4 32.3  RDW 13.9 13.8  LYMPHSABS 3.4  --   MONOABS 1.3*  --   EOSABS 0.4  --   BASOSABS 0.0  --     Chemistries   Recent Labs Lab 08/20/15 2121 08/22/15 0553  NA 133* 132*  K 4.2 4.5  CL 99* 96*  CO2 24 26  GLUCOSE 125* 149*  BUN 6 8  CREATININE 0.71 0.68  CALCIUM 8.9 9.3   ------------------------------------------------------------------------------------------------------------------ estimated creatinine clearance is 136.9 mL/min (by C-G formula based on Cr of 0.68). ------------------------------------------------------------------------------------------------------------------ No results for input(s): HGBA1C in the last 72 hours. ------------------------------------------------------------------------------------------------------------------ No results for input(s): CHOL, HDL, LDLCALC, TRIG, CHOLHDL, LDLDIRECT in the last 72 hours. ------------------------------------------------------------------------------------------------------------------  Recent Labs  08/21/15 1835  TSH 1.104    ------------------------------------------------------------------------------------------------------------------ No results for input(s): VITAMINB12, FOLATE, FERRITIN, TIBC, IRON, RETICCTPCT in the last 72 hours.  Coagulation profile No results for input(s): INR, PROTIME in the last 168 hours.   Recent Labs  08/20/15 2121  DDIMER <0.27    Cardiac Enzymes No results for input(s): CKMB, TROPONINI, MYOGLOBIN in the last 168 hours.  Invalid input(s): CK ------------------------------------------------------------------------------------------------------------------ Invalid input(s): POCBNP    Antino Mayabb D.O. on 08/22/2015 at 4:33 PM  Between 7am to 7pm - Pager - [phone]  After 7pm go to [email or web address] - password [email or web address]  And look for the night coverage person covering for me after hours  [facility] Office  [phone]

## 2015-08-22 NOTE — Progress Notes (Signed)
SATURATION QUALIFICATIONS: (This note is used to comply with regulatory documentation for home oxygen)  Patient Saturations on Room Air at Rest =94%  Patient Saturations on Room Air while Ambulating =95%   

## 2015-08-22 NOTE — Evaluation (Signed)
Clinical/Bedside Swallow Evaluation Patient Details  Name: Garrett Shaffer MRN: [id number] Date of Birth: Apr 04, 1986  Today's Date: 08/22/2015 Time: SLP Start Time (ACUTE ONLY): 1035 SLP Stop Time (ACUTE ONLY): 1101 SLP Time Calculation (min) (ACUTE ONLY): 26 min  Past Medical History:  Past Medical History  Diagnosis Date  . Mental retardation   . Bipolar 1 disorder (HCC)   . Intermittent explosive disorder   . Cerebral palsy (HCC)   . Seizures (HCC)    Past Surgical History: History reviewed. No pertinent past surgical history. HPI:  29 yo male adm to [facility] with difficulty breathing, stridor- ? allergic reaction.  CCM consulted and imaging studies negative, CCM suspects pt with vocal cord dysfunction or laryngopharyngeal reflux.  CXR negative. PMH + for MR, CP, bipolar, seizure d/o.  Swallow evaluation ordered.  Malen Gauze mother present reports pt with intact swallow ability.  Pt agrees to no difficulties swallowing currently.    Assessment / Plan / Recommendation Clinical Impression  Pt presents with minimal throat clearing and cough at baseline that did NOT worsen with intake.  CN exam was negative and pt consumed 4 ounces applesauce, 4 ounces water, 2 cracker boluses with timely swallow and clear voice.  He did report sensation of pharyngeal residuals with applesauce but pt initiated sips of water helped clear.  Pt does admit to reflux symptoms since being hospitalized.  He also eats at very rapid rate, SLP educated pt to importance to slow rate for airway protection especially if he has LPR or VCD.  Pt and family verbalized understanding.  Thanks for this order.     Aspiration Risk  Mild (behavioral)    Diet Recommendation Age appropriate regular solids;Thin   Medication Administration: Whole meds with liquid Compensations: Minimize environmental distractions;Slow rate;Small sips/bites    Other  Recommendations Oral Care Recommendations: Oral care BID   Follow Up Recommendations        Frequency and Duration        Pertinent Vitals/Pain Afebrile, decreased      Swallow Study Prior Functional Status   family denies h/o dysphagia, unintentional weight loss nor pulmonary infections    General Date of Onset: 08/22/15 Other Pertinent Information: 29 yo male adm to [facility] with difficulty breathing, stridor- ? allergic reaction.  CCM consulted and imaging studies negative, CCM suspects pt with vocal cord dysfunction or laryngopharyngeal reflux.  CXR negative. PMH + for MR, CP, bipolar, seizure d/o.  Swallow evaluation ordered.  Malen Gauze mother present reports pt with intact swallow ability.  Pt agrees to no difficulties swallowing currently.  Type of Study: Bedside swallow evaluation Diet Prior to this Study: Regular;Thin liquids Temperature Spikes Noted: No Respiratory Status: Room air History of Recent Intubation: No Behavior/Cognition: Alert;Cooperative;Pleasant mood Oral Cavity - Dentition: Adequate natural dentition/normal for age Self-Feeding Abilities: Able to feed self Patient Positioning: Upright in bed Baseline Vocal Quality: Other (comment) (appears with harsh quality) Volitional Cough: Strong Volitional Swallow: Able to elicit    Oral/Motor/Sensory Function Overall Oral Motor/Sensory Function: Appears within functional limits for tasks assessed   Ice Chips Ice chips: Not tested   Thin Liquid Thin Liquid: Within functional limits Presentation: Self Fed;Cup;Straw    Nectar Thick Nectar Thick Liquid: Not tested   Honey Thick Honey Thick Liquid: Not tested   Puree Puree: Impaired Presentation: Self Fed;Spoon Pharyngeal Phase Impairments: Multiple swallows Other Comments: pt reported sensation of pharyngeal residuals, he took liquids to faciliate clearance   Solid   GO Functional Assessment Tool Used: clinical judgement  Functional Limitations: Swallowing Swallow Current Status [phone]): At least 1 percent but less than 20 percent impaired, limited or  restricted Swallow Goal Status [phone]): At least 1 percent but less than 20 percent impaired, limited or restricted Swallow Discharge Status [phone]): At least 1 percent but less than 20 percent impaired, limited or restricted  Solid: Within functional limits Presentation: Self Lisabeth Pick, MS [facility] SLP [phone]

## 2015-08-22 NOTE — Consult Note (Signed)
Name: Garrett Shaffer MRN: [id number] DOB: Sep 03, 1986    ADMISSION DATE:  08/20/2015 CONSULTATION DATE:  10/8  REFERRING MD :  Catha Gosselin  CHIEF COMPLAINT:  wheeze  BRIEF PATIENT DESCRIPTION:  29 year old male lives at a group home. Has complex history of mental retardation, as well as psychiatric disorder. Admitted initially with a working diagnosis of an allergic reaction of unknown etiology on 10/7 was treated initially with racemic epinephrine, and systemic intramuscular epinephrine with notable improvement per medical team. Was admitted for further monitoring, pulmonary asked to evaluate on 10/8 as patient found to have significant upper airway wheezing  SIGNIFICANT EVENTS    STUDIES:   10/7: Soft tissue neck: No acute obstruction 10/8: Soft tissue neck CT>>>neg  SUBJECTIVE:  Working a little harder today to breath  VITAL SIGNS: Temp:  [97.8 F (36.6 C)-98.2 F (36.8 C)] 97.8 F (36.6 C) (10/09 1439) Pulse Rate:  [68-87] 72 (10/09 1439) Resp:  [15-20] 20 (10/09 1439) BP: (120-137)/(61-63) 120/61 mmHg (10/09 1439) SpO2:  [95 %-99 %] 97 % (10/09 1439)  PHYSICAL EXAMINATION: General:  29 year old male currently resting in chair rr 22 Neuro:  Awake, oriented, at his normal baseline cognition HEENT:  Upper airway stridor mild Cardiovascular:  Regular rate and rhythm Lungs:  Transmitted wheezing from upper airway mild Abdomen:  Soft nontender, no r/g Musculoskeletal:  intact Skin:  intact   Recent Labs Lab 08/20/15 2121 08/22/15 0553  NA 133* 132*  K 4.2 4.5  CL 99* 96*  CO2 24 26  BUN 6 8  CREATININE 0.71 0.68  GLUCOSE 125* 149*    Recent Labs Lab 08/20/15 2121 08/22/15 0553  HGB 13.6 13.1  HCT 42.0 40.5  WBC 12.5* 9.8  PLT 237 221   Dg Neck Soft Tissue  08/20/2015   CLINICAL DATA:  Dyspnea and cough for 1 day.  EXAM: NECK SOFT TISSUES - 1+ VIEW  COMPARISON:  None.  FINDINGS: There is no evidence of retropharyngeal soft tissue swelling or epiglottic  enlargement. The cervical airway is unremarkable and no radio-opaque foreign body identified.  IMPRESSION: Negative.   Electronically Signed   By: Ellery Plunk M.D.   On: 08/20/2015 21:41   Ct Soft Tissue Neck W Contrast  08/21/2015   CLINICAL DATA:  Upper airway wheezing.  EXAM: CT NECK WITH CONTRAST  TECHNIQUE: Multidetector CT imaging of the neck was performed using the standard protocol following the bolus administration of intravenous contrast.  CONTRAST:  75mL OMNIPAQUE IOHEXOL 300 MG/ML  SOLN  COMPARISON:  None.  FINDINGS: There is no lymphadenopathy. There is no nasopharyngeal or oropharyngeal mass. There is no focal fluid collection. The airway is patent. The visualized portions of the carotid arteries and internal jugular veins are patent.  The visualized portions of the brain demonstrate no focal abnormality.  There is significant polypoid mucosal thickening of bilateral maxillary sinuses and ethmoid air cells. The mastoid air cells are normally aerated.  The lung apices are clear.  There is no lytic or blastic osseous lesion.  IMPRESSION: No evidence of soft tissue abnormality within the neck. The airway is patent.  Bilateral maxillary sinuses and ethmoid air cells polypoid mucosal thickening.   Electronically Signed   By: Ted Mcalpine M.D.   On: 08/21/2015 18:54   Dg Chest Portable 1 View  08/20/2015   CLINICAL DATA:  Severe shortness of breath.  Diaphoresis.  EXAM: PORTABLE CHEST 1 VIEW  COMPARISON:  06/19/2010  FINDINGS: Stents of artifact overlies the chest. Heart size  is normal. Mediastinal shadows are normal. The lungs are clear. No effusions. No bony abnormality. Distention of the stomach.  IMPRESSION: Extensive overlying artifact.  No active disease identified.   Electronically Signed   By: Paulina Fusi M.D.   On: 08/20/2015 21:36    ASSESSMENT / PLAN:  Dyspnea with prominent upper airway wheeze. 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 Tyson Alias, MD, FACP Pgr: [phone] [facility] 08/22/2015 4:18 PM

## 2015-08-22 NOTE — Progress Notes (Signed)
No distress noted. Report given to night shift. 02 sat 94%.

## 2015-08-22 NOTE — Consult Note (Signed)
Reason for Consult: Possible upper airway obstruction Referring Physician: Cristal Ford, DO  HPI:  Garrett Shaffer is a 29 y.o. male who lives at a group home. Has complex history of mental retardation, as well as psychiatric disorder. Admitted initially with a working diagnosis of an allergic reaction of unknown etiology on 10/7. He was treated initially with racemic epinephrine, and systemic intramuscular epinephrine with notable improvement per medical team. Was admitted for further monitoring, he was noted to have recurrent wheezing. ENT consulted to evaluate for possible upper airway obstruction.  Past Medical History  Diagnosis Date  . Mental retardation   . Bipolar 1 disorder ([place])   . Intermittent explosive disorder   . Cerebral palsy ([place])   . Seizures ([place])     History reviewed. No pertinent past surgical history.  History reviewed. No pertinent family history.  Social History:  reports that he has never smoked. He has never used smokeless tobacco. He reports that he does not drink alcohol or use illicit drugs.  Allergies: No Known Allergies  Prior to Admission medications   Medication Sig Start Date End Date Taking? Authorizing Provider  ARIPiprazole (ABILIFY) 15 MG tablet Take 15 mg by mouth daily.     Yes Historical Provider, MD  carbamazepine (TEGRETOL) 200 MG tablet Take 200 mg by mouth 2 (two) times daily.     Yes Historical Provider, MD  clonazePAM (KLONOPIN) 0.5 MG tablet Take 0.5 mg by mouth at bedtime as needed. FOR SLEEP   Yes Historical Provider, MD  divalproex (DEPAKOTE) 500 MG DR tablet Take 1,000 mg by mouth at bedtime.    Yes Historical Provider, MD  gabapentin (NEURONTIN) 600 MG tablet Take 1,200 mg by mouth at bedtime.    Yes Historical Provider, MD  propranolol (INNOPRAN XL) 120 MG 24 hr capsule Take 120 mg by mouth daily.    Yes Historical Provider, MD  traZODone (DESYREL) 50 MG tablet Take 50 mg by mouth at bedtime.   Yes Historical Provider, MD   pantoprazole (PROTONIX) 40 MG tablet Take 1 tablet (40 mg total) by mouth daily. 08/22/15   Maryann Mikhail, DO    Medications:  I have reviewed the patient's current medications. Scheduled: . antiseptic oral rinse  7 mL Mouth Rinse BID  . ARIPiprazole  15 mg Oral Daily  . carbamazepine  200 mg Oral BID  . dexamethasone  6 mg Intravenous 4 times per day  . divalproex  1,000 mg Oral QHS  . gabapentin  1,200 mg Oral QHS  . heparin  5,000 Units Subcutaneous 3 times per day  . insulin aspart  0-9 Units Subcutaneous TID WC  . pantoprazole (PROTONIX) IV  40 mg Intravenous Q12H  . traZODone  50 mg Oral QHS   FKC:LEXNTZGYFVCBS, alum & mag hydroxide-simeth, clonazePAM  Results for orders placed or performed during the hospital encounter of 08/20/15 (from the past 48 hour(s))  I-Stat CG4 Lactic Acid, ED     Status: Abnormal   Collection Time: 08/21/15 12:49 AM  Result Value Ref Range   Lactic Acid, Venous 2.69 (HH) 0.5 - 2.0 mmol/L   Comment NOTIFIED PHYSICIAN   Glucose, capillary     Status: Abnormal   Collection Time: 08/21/15  6:17 PM  Result Value Ref Range   Glucose-Capillary 121 (H) 65 - 99 mg/dL  TSH     Status: None   Collection Time: 08/21/15  6:35 PM  Result Value Ref Range   TSH 1.104 0.350 - 4.500 uIU/mL  Glucose, capillary  Status: Abnormal   Collection Time: 08/21/15  8:47 PM  Result Value Ref Range   Glucose-Capillary 140 (H) 65 - 99 mg/dL  CBC     Status: None   Collection Time: 08/22/15  5:53 AM  Result Value Ref Range   WBC 9.8 4.0 - 10.5 K/uL   RBC 4.86 4.22 - 5.81 MIL/uL   Hemoglobin 13.1 13.0 - 17.0 g/dL   HCT 40.5 39.0 - 52.0 %   MCV 83.3 78.0 - 100.0 fL   MCH 27.0 26.0 - 34.0 pg   MCHC 32.3 30.0 - 36.0 g/dL   RDW 13.8 11.5 - 15.5 %   Platelets 221 150 - 400 K/uL  Basic metabolic panel     Status: Abnormal   Collection Time: 08/22/15  5:53 AM  Result Value Ref Range   Sodium 132 (L) 135 - 145 mmol/L   Potassium 4.5 3.5 - 5.1 mmol/L   Chloride 96  (L) 101 - 111 mmol/L   CO2 26 22 - 32 mmol/L   Glucose, Bld 149 (H) 65 - 99 mg/dL   BUN 8 6 - 20 mg/dL   Creatinine, Ser 0.68 0.61 - 1.24 mg/dL   Calcium 9.3 8.9 - 10.3 mg/dL   GFR calc non Af Amer >60 >60 mL/min   GFR calc Af Amer >60 >60 mL/min    Comment: (NOTE) The eGFR has been calculated using the CKD EPI equation. This calculation has not been validated in all clinical situations. eGFR's persistently <60 mL/min signify possible Chronic Kidney Disease.    Anion gap 10 5 - 15  Glucose, capillary     Status: Abnormal   Collection Time: 08/22/15  7:50 AM  Result Value Ref Range   Glucose-Capillary 153 (H) 65 - 99 mg/dL  Glucose, capillary     Status: Abnormal   Collection Time: 08/22/15 12:07 PM  Result Value Ref Range   Glucose-Capillary 140 (H) 65 - 99 mg/dL  Glucose, capillary     Status: Abnormal   Collection Time: 08/22/15  5:03 PM  Result Value Ref Range   Glucose-Capillary 135 (H) 65 - 99 mg/dL  MRSA PCR Screening     Status: None   Collection Time: 08/22/15  7:30 PM  Result Value Ref Range   MRSA by PCR NEGATIVE NEGATIVE    Comment:        The GeneXpert MRSA Assay (FDA approved for NASAL specimens only), is one component of a comprehensive MRSA colonization surveillance program. It is not intended to diagnose MRSA infection nor to guide or monitor treatment for MRSA infections.   Glucose, capillary     Status: Abnormal   Collection Time: 08/22/15  9:57 PM  Result Value Ref Range   Glucose-Capillary 168 (H) 65 - 99 mg/dL    Ct Soft Tissue Neck W Contrast  08/21/2015   CLINICAL DATA:  Upper airway wheezing.  EXAM: CT NECK WITH CONTRAST  TECHNIQUE: Multidetector CT imaging of the neck was performed using the standard protocol following the bolus administration of intravenous contrast.  CONTRAST:  31mL OMNIPAQUE IOHEXOL 300 MG/ML  SOLN  COMPARISON:  None.  FINDINGS: There is no lymphadenopathy. There is no nasopharyngeal or oropharyngeal mass. There is no focal  fluid collection. The airway is patent. The visualized portions of the carotid arteries and internal jugular veins are patent.  The visualized portions of the brain demonstrate no focal abnormality.  There is significant polypoid mucosal thickening of bilateral maxillary sinuses and ethmoid air cells. The mastoid air  cells are normally aerated.  The lung apices are clear.  There is no lytic or blastic osseous lesion.  IMPRESSION: No evidence of soft tissue abnormality within the neck. The airway is patent.  Bilateral maxillary sinuses and ethmoid air cells polypoid mucosal thickening.   Electronically Signed   By: Fidela Salisbury M.D.   On: 08/21/2015 18:54   Review of systems Could not be done due to his mental status.  Blood pressure 147/66, pulse 74, temperature 98.2 F (36.8 C), temperature source Oral, resp. rate 16, height $RemoveBe'5\' 2"'DLJHCjhOb$  (1.575 m), weight 94.3 kg (207 lb 14.3 oz), SpO2 94 %. Physical Exam  Constitutional: He appears well-developed and well-nourished. He appears in no distress. Head: Normocephalic.  Mouth/Throat: No oropharyngeal exudate. Normal mucosa. Ears: Normal auricles and external auditory canals. Eyes: Conjunctivae are normal. Pupils are equal, round, and reactive to light.  Nose: Normal septum, nasal mucosa, and turbinates. Neck: Normal range of motion. No mass or lesion. Pulmonary/Chest: No stridor. Normal respiratory effort. Musculoskeletal: He exhibits no tenderness.  Neurological: He is alert. He exhibits normal muscle tone.  Psychiatric: He has a normal mood and affect.  Nursing note and vitals reviewed.  Procedure:  Flexible Fiberoptic Laryngoscopy Anesthesia: Topical oxymetazoline and lidocaine Indication:  Possible upper airway obstruction Description: Risks, benefits, and alternatives of flexible endoscopy were explained to the patient. Specific mention was made of the risk of throat numbness with difficulty swallowing, possible bleeding from the nose and  mouth, and pain from the procedure.  The nasal cavities were decongested and anesthetised with a combination of oxymetazoline and 4% lidocaine solution.  The flexible scope was inserted into the right nasal cavity and advanced towards the nasopharynx.  Visualized mucosa over the turbinates and septum were normal.  The nasopharynx was clear.  Oropharyngeal walls were symmetric and mobile without lesion, mass, or edema.  Hypopharynx was also without  lesion or edema.  Larynx was mobile without lesions.  No lesions or asymmetry in the supraglottic larynx.  Arytenoid mucosa was normal.  Posterior commissure was also normal.  True vocal folds were symmetric and mobile. He has a widely glottic opening.  Assessment/Plan: Normal laryngoscopy examination. No upper airway obstruction is noted on today's examination. His glottic opening is widely patent. Both vocal cords are mobile.  Cordel Drewes,SUI W 08/22/2015, 11:43 PM

## 2015-08-23 ENCOUNTER — Inpatient Hospital Stay (HOSPITAL_COMMUNITY): Payer: Medicaid Other

## 2015-08-23 DIAGNOSIS — R0600 Dyspnea, unspecified: Secondary | ICD-10-CM

## 2015-08-23 DIAGNOSIS — E872 Acidosis, unspecified: Secondary | ICD-10-CM | POA: Insufficient documentation

## 2015-08-23 DIAGNOSIS — F3162 Bipolar disorder, current episode mixed, moderate: Secondary | ICD-10-CM

## 2015-08-23 DIAGNOSIS — R0603 Acute respiratory distress: Secondary | ICD-10-CM | POA: Insufficient documentation

## 2015-08-23 DIAGNOSIS — D72829 Elevated white blood cell count, unspecified: Secondary | ICD-10-CM | POA: Insufficient documentation

## 2015-08-23 LAB — PULMONARY FUNCTION TEST
FEF 25-75 PRE: 1.89 L/s
FEF 25-75 Post: 0.58 L/sec
FEF2575-%CHANGE-POST: -69 %
FEF2575-%PRED-POST: 15 %
FEF2575-%PRED-PRE: 49 %
FEV1-%CHANGE-POST: -28 %
FEV1-%PRED-POST: 38 %
FEV1-%Pred-Pre: 53 %
FEV1-Post: 1.33 L
FEV1-Pre: 1.84 L
FEV1FVC-%Change-Post: -24 %
FEV1FVC-%PRED-PRE: 105 %
FEV6-%CHANGE-POST: -4 %
FEV6-%PRED-POST: 50 %
FEV6-%Pred-Pre: 52 %
FEV6-Post: 2.04 L
FEV6-Pre: 2.13 L
FEV6FVC-%Pred-Post: 100 %
FEV6FVC-%Pred-Pre: 100 %
FVC-%Change-Post: -4 %
FVC-%Pred-Post: 49 %
FVC-%Pred-Pre: 52 %
FVC-Post: 2.04 L
FVC-Pre: 2.13 L
POST FEV1/FVC RATIO: 65 %
PRE FEV6/FVC RATIO: 100 %
Post FEV6/FVC ratio: 100 %
Pre FEV1/FVC ratio: 86 %

## 2015-08-23 LAB — GLUCOSE, CAPILLARY
GLUCOSE-CAPILLARY: 189 mg/dL — AB (ref 65–99)
GLUCOSE-CAPILLARY: 163 mg/dL — AB (ref 65–99)
GLUCOSE-CAPILLARY: 121 mg/dL — AB (ref 65–99)
Glucose-Capillary: 139 mg/dL — ABNORMAL HIGH (ref 65–99)
Glucose-Capillary: 144 mg/dL — ABNORMAL HIGH (ref 65–99)

## 2015-08-23 LAB — LACTIC ACID, PLASMA: Lactic Acid, Venous: 2 mmol/L (ref 0.5–2.0)

## 2015-08-23 MED ORDER — IPRATROPIUM-ALBUTEROL 0.5-2.5 (3) MG/3ML IN SOLN
3.0000 mL | Freq: Four times a day (QID) | RESPIRATORY_TRACT | Status: DC
  Administered 2015-08-23: 3 mL via RESPIRATORY_TRACT
  Filled 2015-08-23: qty 3

## 2015-08-23 MED ORDER — ALBUTEROL SULFATE (2.5 MG/3ML) 0.083% IN NEBU
2.5000 mg | INHALATION_SOLUTION | Freq: Once | RESPIRATORY_TRACT | Status: AC
  Administered 2015-08-23: 2.5 mg via RESPIRATORY_TRACT

## 2015-08-23 MED ORDER — IPRATROPIUM-ALBUTEROL 0.5-2.5 (3) MG/3ML IN SOLN
3.0000 mL | Freq: Two times a day (BID) | RESPIRATORY_TRACT | Status: DC
  Administered 2015-08-24: 3 mL via RESPIRATORY_TRACT
  Filled 2015-08-23 (×2): qty 3

## 2015-08-23 MED ORDER — BUDESONIDE 0.25 MG/2ML IN SUSP
0.2500 mg | Freq: Four times a day (QID) | RESPIRATORY_TRACT | Status: DC
  Administered 2015-08-23: 0.25 mg via RESPIRATORY_TRACT
  Filled 2015-08-23: qty 2

## 2015-08-23 MED ORDER — METHYLPREDNISOLONE SODIUM SUCC 40 MG IJ SOLR
20.0000 mg | Freq: Two times a day (BID) | INTRAMUSCULAR | Status: DC
  Administered 2015-08-23 – 2015-08-24 (×3): 20 mg via INTRAVENOUS
  Filled 2015-08-23 (×3): qty 1

## 2015-08-23 MED ORDER — PANTOPRAZOLE SODIUM 40 MG PO TBEC
40.0000 mg | DELAYED_RELEASE_TABLET | Freq: Two times a day (BID) | ORAL | Status: DC
  Administered 2015-08-23 – 2015-08-24 (×2): 40 mg via ORAL
  Filled 2015-08-23 (×2): qty 1

## 2015-08-23 MED ORDER — BUDESONIDE 0.25 MG/2ML IN SUSP
0.2500 mg | Freq: Two times a day (BID) | RESPIRATORY_TRACT | Status: DC
  Administered 2015-08-24: 0.25 mg via RESPIRATORY_TRACT
  Filled 2015-08-23 (×2): qty 2

## 2015-08-23 NOTE — Consult Note (Signed)
   Name: Garrett Shaffer MRN: [id number] DOB: Jun 01, 1986    ADMISSION DATE:  08/20/2015 CONSULTATION DATE:  10/8  REFERRING MD :  Catha Gosselin  CHIEF COMPLAINT:  wheeze  BRIEF PATIENT DESCRIPTION:  29 year old male lives at a group home. Has complex history of mental retardation, as well as psychiatric disorder. Admitted initially with a working diagnosis of an allergic reaction of unknown etiology on 10/7 was treated initially with racemic epinephrine, and systemic intramuscular epinephrine with notable improvement per medical team. Was admitted for further monitoring, pulmonary asked to evaluate on 10/8 as patient found to have significant upper airway wheezing  SIGNIFICANT EVENTS    STUDIES:   10/7: Soft tissue neck: No acute obstruction 10/8: Soft tissue neck CT>>>neg  SUBJECTIVE:    VITAL SIGNS: Temp:  [97.1 F (36.2 C)-98.2 F (36.8 C)] 97.5 F (36.4 C) (10/10 1220) Pulse Rate:  [62-82] 62 (10/10 1220) Resp:  [8-23] 18 (10/10 1220) BP: (120-147)/(61-83) 124/61 mmHg (10/10 1220) SpO2:  [94 %-100 %] 95 % (10/10 1220) Weight:  [94.3 kg (207 lb 14.3 oz)] 94.3 kg (207 lb 14.3 oz) (10/09 1755)  PHYSICAL EXAMINATION: General:  28 year old male no distress Neuro:  Awake, oriented, at his normal baseline cognition HEENT:  Upper airway stridor improved Cardiovascular:  Regular rate and rhythm Lungs:  Transmitted wheezing likley mild Abdomen:  Soft nontender, no r/g Musculoskeletal:  intact Skin:  intact   Recent Labs Lab 08/20/15 2121 08/22/15 0553  NA 133* 132*  K 4.2 4.5  CL 99* 96*  CO2 24 26  BUN 6 8  CREATININE 0.71 0.68  GLUCOSE 125* 149*    Recent Labs Lab 08/20/15 2121 08/22/15 0553  HGB 13.6 13.1  HCT 42.0 40.5  WBC 12.5* 9.8  PLT 237 221   Ct Soft Tissue Neck W Contrast  08/21/2015   CLINICAL DATA:  Upper airway wheezing.  EXAM: CT NECK WITH CONTRAST  TECHNIQUE: Multidetector CT imaging of the neck was performed using the standard protocol following  the bolus administration of intravenous contrast.  CONTRAST:  75mL OMNIPAQUE IOHEXOL 300 MG/ML  SOLN  COMPARISON:  None.  FINDINGS: There is no lymphadenopathy. There is no nasopharyngeal or oropharyngeal mass. There is no focal fluid collection. The airway is patent. The visualized portions of the carotid arteries and internal jugular veins are patent.  The visualized portions of the brain demonstrate no focal abnormality.  There is significant polypoid mucosal thickening of bilateral maxillary sinuses and ethmoid air cells. The mastoid air cells are normally aerated.  The lung apices are clear.  There is no lytic or blastic osseous lesion.  IMPRESSION: No evidence of soft tissue abnormality within the neck. The airway is patent.  Bilateral maxillary sinuses and ethmoid air cells polypoid mucosal thickening.   Electronically Signed   By: Ted Mcalpine M.D.   On: 08/21/2015 18:54    ASSESSMENT / PLAN:  Dyspnea with prominent upper airway wheeze R/o VCD not seen on laryngoscopy R/o aspiration causing VCD R/o underlying asthma  Plan ENT neg noted, still could have VCD intermittent Keep ppi Change decadron to solumedral Add pulmicort Add douneb Get pft today Slow eating He is improved to some degree,move to floor Will need ambulation pulse again  Mcarthur Rossetti. Tyson Alias, MD, FACP Pgr: [phone] [facility] 08/23/2015 2:24 PM

## 2015-08-23 NOTE — Clinical Documentation Improvement (Signed)
Internal Medicine Critical Care  Can the diagnosis of Respiratory distress be further specified in progress notes and discharge summary?   Acute respiratory failure  Other  Clinically Undetermined  Document any associated diagnoses/conditions.   Supporting Information: Chief complaint: Shortness of breath, Respirtory distress ED provider note: The patient was extremely distressed, had audible wheezing and stridor, was tripoding, and was breathing approximately 30 times a minute HENT: Positive for congestion and sore throat.  Respiratory: Positive for chest tightness, shortness of breath, wheezing and stridor. Pulmonary/Chest: Stridor present. Tachypnea noted. He is in respiratory distress. He has wheezes. He exhibits no tenderness.  The patient's oxygen supplementation was de-escalated to 2 L nasal cannula. When this was removed, the patient had his oxygen saturation drop into the 80s H&P Patient presents to ED this evening with sudden onset of SOB, wheezing. Symptoms onset 1 hr PTA. Per caregiver no new foods, contacts, etc that explain the apparent allergic reaction. After being given epinephrine, his breathing is significantly improved. 3. Ananaphylactic reaction with hypoxia - patient has no h/o asthma, no h/o COPD, never smoker, and is already demonstrating more rapid improvement than one would expect from most other severe acute onset respiratory syndromes. 1. O2 via [place] 2. Continue neb treatments per adult wheeze protocol 3. Have ordered prednisone CCM consult note: ASSESSMENT / PLAN: Dyspnea with prominent upper airway wheeze. Favor of laryngopharyngeal reflux disease/vocal cord dysfunction, however need to rule out upper airway obstruction. There is no stridor on exam, this is all expiratory wheezing which would favor LPR/VCD. These are often multi-factorial. Reflux, nasal drainage, recent URI, and anxiety/psychiatric disorders heavily contributing factors. Stiff difficult  to say whether or not his initial presentation was simply this, or true allergic reaction.  Was admitted for further monitoring, pulmonary asked to evaluate on 10/8 as patient found to have significant upper airway wheezing   Component     Latest Ref Rng 08/20/2015  pH, Arterial     7.350 - 7.450 7.430  pCO2 arterial     35.0 - 45.0 mmHg 38.1  pO2, Arterial     80.0 - 100.0 mmHg 96.0  Bicarbonate     20.0 - 24.0 mEq/L 25.3 (H)  TCO2     0 - 100 mmol/L 26  O2 Saturation      98.0  Acid-Base Excess     0.0 - 2.0 mmol/L 1.0  Patient temperature      98.7 F  Collection site      RADIAL, ALLEN'S TEST ACCEPTABLE  Drawn by      RT  Sample type      ARTERIAL    Racepinephrine neb x 1 10/9 Pulse ox continuous Flexible Fiberoptic Laryngoscopy   Please exercise your independent, professional judgment when responding. A specific answer is not anticipated or expected.   Thank You,  Harless Litten [employer] [facility] [place] [phone]

## 2015-08-23 NOTE — Progress Notes (Addendum)
[facility]                                                                              Patient Demographics  Garrett Shaffer, is a 29 y.o. male, DOB - 03-31-1986, ZOX:[id number]  Admit date - 08/20/2015   Admitting Physician Hillary Bow, DO  Outpatient Primary MD for the patient is [facility], MD  LOS - 1   Chief Complaint  Patient presents with  . Shortness of Breath      HPI on 08/21/2015 by Dr. Lyda Perone Garrett Shaffer is a 29 y.o. male with h/o MR, psych disorders, seizures, but no other medical issues chronically. Patient presents to ED this evening with sudden onset of SOB, wheezing. Symptoms onset 1 hr PTA. Per caregiver no new foods, contacts, etc that explain the apparent allergic reaction. After being given epinephrine, his breathing is significantly improved.  Assessment & Plan   Acute respiratory distress with hypoxia/wheezing -Thought to be an anaphylactic reaction, however, unknown cause -Patient does not have history of asthma or COPD, never smoker -upon arrival to ED, patient was found to have significant wheezing, stridor, tripoding and tachypnea. Patient was given racemic epinephrine. -d-dimer negative -chest x-ray and lateral neck x-ray unremarkable -PCCM consulted and appreciated, felt this could be vocal cord dysfunction -CT soft tissue neck: No evidence of soft tissue abnormality, airway patent -TSH 1.104 -Patient initially placed on prednisone, but switched to decadron IV q6, and tapered to q12hrs -Speech evaluated patient, recommended regular solid foods and liquids, slow rate with small sips and bites. Patient was not found to have any overt indications of airway compromise or intake -Oxygen saturations upon room air with ambulation 95% -Spoke with Dr. Tyson Alias, recommended ENT be consulted  -PFTs pending -ENT consulted and appreciated, s/p flexible fiberoptic larygnoscopy- normal exam, no upper airway obstruction, glottic  opening patent, both vocal cords are mobile   Bipolar disorder/Seizure disorder -Continue abilify and tegretol, depokote  Leukocytosis -Resolved, Likely reactive -Continue to monitor CBC -No source of infection  GERD -Continue PPI (per Pulm recs: BID 2weeks)  Lactic acidosis -Likely secondary to respiratory status -lactic acid 2 (from 2.69) -Continue to monitor   Code Status: Full  Family Communication: None at bedside  Disposition Plan: Admitted.  Pending PFTs and further pulm recommendations  Time Spent in minutes 30 minutes  Procedures  Bedside Echocardiogram by EDP: No abnormality noted Bedside Ultrasound- by EDP: No PTX Flexible fiberoptic larygnoscopy  Consults  PCCM ENT  DVT Prophylaxis Heparin  Lab Results  Component Value Date   PLT 221 08/22/2015    Medications  Scheduled Meds: . antiseptic oral rinse  7 mL Mouth Rinse BID  . ARIPiprazole  15 mg Oral Daily  . carbamazepine  200 mg Oral BID  . dexamethasone  6 mg Intravenous 4 times per day  . divalproex  1,000 mg Oral QHS  . gabapentin  1,200 mg Oral QHS  . heparin  5,000 Units Subcutaneous 3 times per day  . insulin aspart  0-9 Units Subcutaneous TID WC  . pantoprazole  40 mg Oral BID  . traZODone  50 mg Oral QHS   Continuous Infusions:  PRN Meds:.acetaminophen, alum &  mag hydroxide-simeth, clonazePAM  Antibiotics    Anti-infectives    None      Subjective:   Garrett Shaffer seen and examined today.  Patient states he is feeling better.  Denies chest pain, headache, dizziness, abdominal pain.  Denies shortness of breath at this time.     Objective:   Filed Vitals:   08/22/15 2359 08/23/15 0400 08/23/15 0809 08/23/15 1220  BP: 130/62  143/63 124/61  Pulse: 82  81 62  Temp: 98 F (36.7 C) 98 F (36.7 C) 97.7 F (36.5 C) 97.5 F (36.4 C)  TempSrc: Oral Axillary Oral Oral  Resp: Height:      Weight:      SpO2: 96%  97% 95%    Wt Readings from Last 3  Encounters:  08/22/15 94.3 kg (207 lb 14.3 oz)  08/13/09 80.151 kg (176 lb 11.2 oz)  05/22/08 78.472 kg (173 lb)     Intake/Output Summary (Last 24 hours) at 08/23/15 1339 Last data filed at 08/23/15 1110  Gross per 24 hour  Intake    717 ml  Output   3400 ml  Net  -2683 ml    Exam  General: Well developed, well nourished, NAD  HEENT: NCAT, mucous membranes moist.  Cardiovascular: S1 S2 auscultated, RRR, no murmurs  Respiratory: Clear to auscultation bilaterally.  Abdomen: Soft, obese, nontender, nondistended, + bowel sounds  Extremities: warm dry without cyanosis clubbing or edema  Neuro: AAOx3, Nonfocal (does have MR- but at baseline)  Psych: Appropriate mood and affect, pleasant  Data Review   Micro Results Recent Results (from the past 240 hour(s))  MRSA PCR Screening     Status: None   Collection Time: 08/22/15  7:30 PM  Result Value Ref Range Status   MRSA by PCR NEGATIVE NEGATIVE Final    Comment:        The GeneXpert MRSA Assay (FDA approved for NASAL specimens only), is one component of a comprehensive MRSA colonization surveillance program. It is not intended to diagnose MRSA infection nor to guide or monitor treatment for MRSA infections.     Radiology Reports Dg Neck Soft Tissue  08/20/2015   CLINICAL DATA:  Dyspnea and cough for 1 day.  EXAM: NECK SOFT TISSUES - 1+ VIEW  COMPARISON:  None.  FINDINGS: There is no evidence of retropharyngeal soft tissue swelling or epiglottic enlargement. The cervical airway is unremarkable and no radio-opaque foreign body identified.  IMPRESSION: Negative.   Electronically Signed   By: Ellery Plunk M.D.   On: 08/20/2015 21:41   Ct Soft Tissue Neck W Contrast  08/21/2015   CLINICAL DATA:  Upper airway wheezing.  EXAM: CT NECK WITH CONTRAST  TECHNIQUE: Multidetector CT imaging of the neck was performed using the standard protocol following the bolus administration of intravenous contrast.  CONTRAST:  75mL  OMNIPAQUE IOHEXOL 300 MG/ML  SOLN  COMPARISON:  None.  FINDINGS: There is no lymphadenopathy. There is no nasopharyngeal or oropharyngeal mass. There is no focal fluid collection. The airway is patent. The visualized portions of the carotid arteries and internal jugular veins are patent.  The visualized portions of the brain demonstrate no focal abnormality.  There is significant polypoid mucosal thickening of bilateral maxillary sinuses and ethmoid air cells. The mastoid air cells are normally aerated.  The lung apices are clear.  There is no lytic or blastic osseous lesion.  IMPRESSION: No evidence of soft tissue abnormality within the neck. The airway is patent.  Bilateral maxillary sinuses and ethmoid air cells polypoid mucosal thickening.   Electronically Signed   By: Ted Mcalpine M.D.   On: 08/21/2015 18:54   Dg Chest Portable 1 View  08/20/2015   CLINICAL DATA:  Severe shortness of breath.  Diaphoresis.  EXAM: PORTABLE CHEST 1 VIEW  COMPARISON:  06/19/2010  FINDINGS: Stents of artifact overlies the chest. Heart size is normal. Mediastinal shadows are normal. The lungs are clear. No effusions. No bony abnormality. Distention of the stomach.  IMPRESSION: Extensive overlying artifact.  No active disease identified.   Electronically Signed   By: Paulina Fusi M.D.   On: 08/20/2015 21:36    CBC  Recent Labs Lab 08/20/15 2121 08/22/15 0553  WBC 12.5* 9.8  HGB 13.6 13.1  HCT 42.0 40.5  PLT 237 221  MCV 83.8 83.3  MCH 27.1 27.0  MCHC 32.4 32.3  RDW 13.9 13.8  LYMPHSABS 3.4  --   MONOABS 1.3*  --   EOSABS 0.4  --   BASOSABS 0.0  --     Chemistries   Recent Labs Lab 08/20/15 2121 08/22/15 0553  NA 133* 132*  K 4.2 4.5  CL 99* 96*  CO2 24 26  GLUCOSE 125* 149*  BUN 6 8  CREATININE 0.71 0.68  CALCIUM 8.9 9.3   ------------------------------------------------------------------------------------------------------------------ estimated creatinine clearance is 138.3 mL/min (by  C-G formula based on Cr of 0.68). ------------------------------------------------------------------------------------------------------------------ No results for input(s): HGBA1C in the last 72 hours. ------------------------------------------------------------------------------------------------------------------ No results for input(s): CHOL, HDL, LDLCALC, TRIG, CHOLHDL, LDLDIRECT in the last 72 hours. ------------------------------------------------------------------------------------------------------------------  Recent Labs  08/21/15 1835  TSH 1.104   ------------------------------------------------------------------------------------------------------------------ No results for input(s): VITAMINB12, FOLATE, FERRITIN, TIBC, IRON, RETICCTPCT in the last 72 hours.  Coagulation profile No results for input(s): INR, PROTIME in the last 168 hours.   Recent Labs  08/20/15 2121  DDIMER <0.27    Cardiac Enzymes No results for input(s): CKMB, TROPONINI, MYOGLOBIN in the last 168 hours.  Invalid input(s): CK ------------------------------------------------------------------------------------------------------------------ Invalid input(s): POCBNP    Jesica Goheen D.O. on 08/23/2015 at 1:39 PM  Between 7am to 7pm - Pager - [phone]  After 7pm go to [email or web address] - password [email or web address]  And look for the night coverage person covering for me after hours  [facility] Office  [phone]

## 2015-08-24 ENCOUNTER — Inpatient Hospital Stay (HOSPITAL_COMMUNITY): Payer: Medicaid Other

## 2015-08-24 LAB — CBC
HEMATOCRIT: 40.7 % (ref 39.0–52.0)
HEMOGLOBIN: 13.3 g/dL (ref 13.0–17.0)
MCH: 27.1 pg (ref 26.0–34.0)
MCHC: 32.7 g/dL (ref 30.0–36.0)
MCV: 83.1 fL (ref 78.0–100.0)
Platelets: 239 10*3/uL (ref 150–400)
RBC: 4.9 MIL/uL (ref 4.22–5.81)
RDW: 14 % (ref 11.5–15.5)
WBC: 10.8 10*3/uL — AB (ref 4.0–10.5)

## 2015-08-24 LAB — BASIC METABOLIC PANEL
ANION GAP: 9 (ref 5–15)
BUN: 13 mg/dL (ref 6–20)
CO2: 26 mmol/L (ref 22–32)
Calcium: 8.9 mg/dL (ref 8.9–10.3)
Chloride: 100 mmol/L — ABNORMAL LOW (ref 101–111)
Creatinine, Ser: 0.5 mg/dL — ABNORMAL LOW (ref 0.61–1.24)
GLUCOSE: 125 mg/dL — AB (ref 65–99)
POTASSIUM: 4 mmol/L (ref 3.5–5.1)
Sodium: 135 mmol/L (ref 135–145)

## 2015-08-24 LAB — GLUCOSE, CAPILLARY
GLUCOSE-CAPILLARY: 120 mg/dL — AB (ref 65–99)
Glucose-Capillary: 96 mg/dL (ref 65–99)
Glucose-Capillary: 89 mg/dL (ref 65–99)

## 2015-08-24 LAB — LACTIC ACID, PLASMA: Lactic Acid, Venous: 1.8 mmol/L (ref 0.5–2.0)

## 2015-08-24 MED ORDER — PANTOPRAZOLE SODIUM 40 MG PO TBEC: 40.0000 mg | DELAYED_RELEASE_TABLET | Freq: Two times a day (BID) | ORAL | Status: AC

## 2015-08-24 MED ORDER — BUDESONIDE 0.25 MG/2ML IN SUSP: 0.2500 mg | Freq: Two times a day (BID) | RESPIRATORY_TRACT | Status: AC

## 2015-08-24 MED ORDER — IPRATROPIUM-ALBUTEROL 0.5-2.5 (3) MG/3ML IN SOLN: 3.0000 mL | Freq: Two times a day (BID) | RESPIRATORY_TRACT | Status: DC

## 2015-08-24 MED ORDER — FLUTICASONE PROPIONATE 50 MCG/ACT NA SUSP: 2.0000 | Freq: Every day | NASAL | Status: DC

## 2015-08-24 NOTE — Progress Notes (Signed)
Patient given discharge instructions in room by first shift RN.  Malen Gauze mom is in room and has texted his transportation for discharge.

## 2015-08-24 NOTE — Progress Notes (Signed)
   Name: DREON PINEDA MRN: [id number] DOB: 1985/11/28    ADMISSION DATE:  08/20/2015 CONSULTATION DATE:  10/8  REFERRING MD :  Catha Gosselin  CHIEF COMPLAINT:  wheeze  BRIEF PATIENT DESCRIPTION:  29 year old male lives at a group home. Has complex history of mental retardation, as well as psychiatric disorder. Admitted initially with a working diagnosis of an allergic reaction of unknown etiology on 10/7 was treated initially with racemic epinephrine, and systemic intramuscular epinephrine with notable improvement per medical team. Was admitted for further monitoring, pulmonary asked to evaluate on 10/8 as patient found to have significant upper airway wheezing  SIGNIFICANT EVENTS    STUDIES:   10/7: Soft tissue neck: No acute obstruction 10/8: Soft tissue neck CT>>>neg  SUBJECTIVE:  Feels better   VITAL SIGNS: Temp:  [97.6 F (36.4 C)-98 F (36.7 C)] 97.6 F (36.4 C) (10/11 0900) Pulse Rate:  [66-82] 82 (10/11 0900) Resp:  [16-17] 16 (10/11 0900) BP: (104-112)/(40-49) 104/40 mmHg (10/11 0900) SpO2:  [95 %-98 %] 98 % (10/11 0812) FiO2 (%):  [21 %] 21 % (10/11 8657)  PHYSICAL EXAMINATION: General:  29 year old male no distress Neuro:  Awake, oriented, at his normal baseline cognition HEENT:  Upper airway stridor resolved Cardiovascular:  Regular rate and rhythm Lungs: clear Abdomen:  Soft nontender, no r/g Musculoskeletal:  intact Skin:  intact   Recent Labs Lab 08/20/15 2121 08/22/15 0553 08/24/15 0304  NA 133* 132* 135  K 4.2 4.5 4.0  CL 99* 96* 100*  CO2 BUN CREATININE 0.71 0.68 0.50*  GLUCOSE 125* 149* 125*    Recent Labs Lab 08/20/15 2121 08/22/15 0553 08/24/15 0304  HGB 13.6 13.1 13.3  HCT 42.0 40.5 40.7  WBC 12.5* 9.8 10.8*  PLT 237 221 239   Dg Chest Port 1 View  08/24/2015   CLINICAL DATA:  Cough  EXAM: PORTABLE CHEST 1 VIEW  COMPARISON:  08/20/2015  FINDINGS: The heart size and mediastinal contours are within normal limits.  Both lungs are clear. The visualized skeletal structures are unremarkable.  IMPRESSION: No active disease.   Electronically Signed   By: Marlan Palau M.D.   On: 08/24/2015 08:17    ASSESSMENT / PLAN:  Dyspnea with prominent upper airway wheeze, suspect LPR R/o reflux causing VCD R/o sinus drainage R/o underlying asthma  Plan Keep ppi; BID until follow up GI, consider further imaging D/c steroids Slow eating per SLP Have set him up w/ Dr Isaiah Serge 10/25 at 3pm - pulmonary Can dc him on albuteral as he has clinically improved with this to some degree PFT after review with restriction from body size , but had poor effort and loops - not accurate likely Sinus dz  Likely with drainage, add flonase, nasal saline  Can ambulate and check sats, if ok, can dc home   08/24/2015 1:46 PM  Mcarthur Rossetti. Tyson Alias, MD, FACP Pgr: [phone] [facility]

## 2015-08-24 NOTE — Progress Notes (Signed)
SATURATION QUALIFICATIONS: (This note is used to comply with regulatory documentation for home oxygen)  Patient Saturations on Room Air at Rest = 100%  Patient Saturations on Room Air while Ambulating = 100%  

## 2015-08-24 NOTE — Progress Notes (Signed)
Discharge instructions given to Garrett Shaffer caregiver for the patient.  Patient left walking alert and oriented to person, place and time in stable condition to home.

## 2015-08-24 NOTE — Progress Notes (Signed)
DC home; waiting for ride.

## 2015-08-24 NOTE — Progress Notes (Signed)
Discussed patients medication with  Gweneth Fritter  Over the phone.  All belongings sent home.

## 2015-08-24 NOTE — Discharge Summary (Signed)
Physician Discharge Summary  Garrett Shaffer WUJ:[id number] DOB: May 31, 1986 DOA: 08/20/2015  PCP: Julieanne Manson, MD  Admit date: 08/20/2015 Discharge date: 08/24/2015  Time spent: 45 minutes  Recommendations for Outpatient Follow-up:  Patient will be discharged to home.  Patient will need to follow up with primary care provider within one week of discharge., repeat CBC  Follow up with Dr. Isaiah Serge 09/07/2015 at 3pm. Also follow up with gastroenterology. Patient should continue medications as prescribed.  Patient should follow a regular diet.   Discharge Diagnoses:  Acute respiratory distress with hypoxia/wheezing/obstructive airway disease  Bipolar disorder/seizure disorder GERD Leukocytosis Lactic acidosis  Discharge Condition: Stable  Diet recommendation: Regular  Filed Weights   08/21/15 0557 08/22/15 1755  Weight: 92.625 kg (204 lb 3.2 oz) 94.3 kg (207 lb 14.3 oz)    History of present illness:  on 08/21/2015 by Dr. Jeannine Kitten is a 29 y.o. male with h/o MR, psych disorders, seizures, but no other medical issues chronically. Patient presents to ED this evening with sudden onset of SOB, wheezing. Symptoms onset 1 hr PTA. Per caregiver no new foods, contacts, etc that explain the apparent allergic reaction. After being given epinephrine, his breathing is significantly improved.  Hospital Course:  Acute respiratory distress with hypoxia/wheezing/Obstructive airway disease -Thought to be an anaphylactic reaction, however, unknown cause -Patient does not have history of asthma or COPD, never smoker -upon arrival to ED, patient was found to have significant wheezing, stridor, tripoding and tachypnea. Patient was given racemic epinephrine. -d-dimer negative -chest x-ray and lateral neck x-ray unremarkable -PCCM consulted and appreciated, felt this could be vocal cord dysfunction -CT soft tissue neck: No evidence of soft tissue abnormality, airway patent -TSH  1.104 -Patient initially placed on prednisone, but switched to decadron IV q6, and tapered to q12hrs -Speech evaluated patient, recommended regular solid foods and liquids, slow rate with small sips and bites. Patient was not found to have any overt indications of airway compromise or intake -Oxygen saturations upon room air with ambulation 95% -ENT consulted and appreciated, s/p flexible fiberoptic larygnoscopy- normal exam, no upper airway obstruction, glottic opening patent, both vocal cords are mobile -PFTs: mod to severe obstructive airway disease -Will discharge patient with nebulizer/treatments  Bipolar disorder/Seizure disorder -Continue abilify and tegretol, depokote  Leukocytosis -Likely reactive, was on steroids -No source of infection -repeat CBC in one week  GERD -Continue PPI (per Pulm recs: BID 2weeks), followed by daily  Lactic acidosis -Likely secondary to respiratory status -lactic acid 1.8 (from 2.69) -Continue to monitor   Procedures  Bedside Echocardiogram by EDP: No abnormality noted Bedside Ultrasound- by EDP: No PTX Flexible fiberoptic larygnoscopy PFTs  Consults  PCCM ENT  Discharge Exam: Filed Vitals:   08/24/15 1356  BP: 95/69  Pulse: 73  Temp: 97.5 F (36.4 C)  Resp: 15   Exam  General: Well developed, well nourished, NAD  HEENT: NCAT, mucous membranes moist.  Cardiovascular: S1 S2 auscultated, RRR, no murmurs  Respiratory: Clear to auscultation bilaterally.  Abdomen: Soft, obese, nontender, nondistended, + bowel sounds  Extremities: warm dry without cyanosis clubbing or edema  Neuro: AAOx3, Nonfocal (does have MR- but at baseline)  Psych: Appropriate mood and affect, pleasant  Discharge Instructions      Discharge Instructions    Discharge instructions    Complete by:  As directed   Patient will be discharged to home.  Patient will need to follow up with primary care provider within one week of discharge., repeat  CBC  Follow up with Dr. Isaiah Serge 09/07/2015 at 3pm. Also follow up with gastroenterology. Patient should continue medications as prescribed.  Patient should follow a regular diet.            Medication List    STOP taking these medications        propranolol 120 MG 24 hr capsule  Commonly known as:  INNOPRAN XL      TAKE these medications        ARIPiprazole 15 MG tablet  Commonly known as:  ABILIFY  Take 15 mg by mouth daily.     budesonide 0.25 MG/2ML nebulizer solution  Commonly known as:  PULMICORT  Take 2 mLs (0.25 mg total) by nebulization 2 (two) times daily.     carbamazepine 200 MG tablet  Commonly known as:  TEGRETOL  Take 200 mg by mouth 2 (two) times daily.     clonazePAM 0.5 MG tablet  Commonly known as:  KLONOPIN  Take 0.5 mg by mouth at bedtime as needed. FOR SLEEP     divalproex 500 MG DR tablet  Commonly known as:  DEPAKOTE  Take 1,000 mg by mouth at bedtime.     gabapentin 600 MG tablet  Commonly known as:  NEURONTIN  Take 1,200 mg by mouth at bedtime.     ipratropium-albuterol 0.5-2.5 (3) MG/3ML Soln  Commonly known as:  DUONEB  Take 3 mLs by nebulization 2 (two) times daily.     pantoprazole 40 MG tablet  Commonly known as:  PROTONIX  Take 1 tablet (40 mg total) by mouth 2 (two) times daily.     traZODone 50 MG tablet  Commonly known as:  DESYREL  Take 50 mg by mouth at bedtime.       No Known Allergies Follow-up Information    Follow up with [facility], [place]. Schedule an appointment as soon as possible for a visit in 1 week.   Specialty:  Internal Medicine   Why:  Hospital follow up   Contact information:   [address] [place] [address] [phone]       Follow up with Chilton Greathouse, MD On 09/07/2015.   Specialty:  Pulmonary Disease   Why:  3pm    Contact information:   [address] [place] [address] [phone]       Follow up with [facility]. Schedule an appointment as soon as  possible for a visit in 1 week.   Specialty:  Gastroenterology   Why:  Acid reflux   Contact information:   [address] [place] [address] [phone]       The results of significant diagnostics from this hospitalization (including imaging, microbiology, ancillary and laboratory) are listed below for reference.    Significant Diagnostic Studies: Dg Neck Soft Tissue  08/20/2015   CLINICAL DATA:  Dyspnea and cough for 1 day.  EXAM: NECK SOFT TISSUES - 1+ VIEW  COMPARISON:  None.  FINDINGS: There is no evidence of retropharyngeal soft tissue swelling or epiglottic enlargement. The cervical airway is unremarkable and no radio-opaque foreign body identified.  IMPRESSION: Negative.   Electronically Signed   By: Ellery Plunk M.D.   On: 08/20/2015 21:41   Ct Soft Tissue Neck W Contrast  08/21/2015   CLINICAL DATA:  Upper airway wheezing.  EXAM: CT NECK WITH CONTRAST  TECHNIQUE: Multidetector CT imaging of the neck was performed using the standard protocol following the bolus administration of intravenous contrast.  CONTRAST:  75mL  OMNIPAQUE IOHEXOL 300 MG/ML  SOLN  COMPARISON:  None.  FINDINGS: There is no lymphadenopathy. There is no nasopharyngeal or oropharyngeal mass. There is no focal fluid collection. The airway is patent. The visualized portions of the carotid arteries and internal jugular veins are patent.  The visualized portions of the brain demonstrate no focal abnormality.  There is significant polypoid mucosal thickening of bilateral maxillary sinuses and ethmoid air cells. The mastoid air cells are normally aerated.  The lung apices are clear.  There is no lytic or blastic osseous lesion.  IMPRESSION: No evidence of soft tissue abnormality within the neck. The airway is patent.  Bilateral maxillary sinuses and ethmoid air cells polypoid mucosal thickening.   Electronically Signed   By: Ted Mcalpine M.D.   On: 08/21/2015 18:54   Dg Chest Port 1  View  08/24/2015   CLINICAL DATA:  Cough  EXAM: PORTABLE CHEST 1 VIEW  COMPARISON:  08/20/2015  FINDINGS: The heart size and mediastinal contours are within normal limits. Both lungs are clear. The visualized skeletal structures are unremarkable.  IMPRESSION: No active disease.   Electronically Signed   By: Marlan Palau M.D.   On: 08/24/2015 08:17   Dg Chest Portable 1 View  08/20/2015   CLINICAL DATA:  Severe shortness of breath.  Diaphoresis.  EXAM: PORTABLE CHEST 1 VIEW  COMPARISON:  06/19/2010  FINDINGS: Stents of artifact overlies the chest. Heart size is normal. Mediastinal shadows are normal. The lungs are clear. No effusions. No bony abnormality. Distention of the stomach.  IMPRESSION: Extensive overlying artifact.  No active disease identified.   Electronically Signed   By: Paulina Fusi M.D.   On: 08/20/2015 21:36    Microbiology: Recent Results (from the past 240 hour(s))  MRSA PCR Screening     Status: None   Collection Time: 08/22/15  7:30 PM  Result Value Ref Range Status   MRSA by PCR NEGATIVE NEGATIVE Final    Comment:        The GeneXpert MRSA Assay (FDA approved for NASAL specimens only), is one component of a comprehensive MRSA colonization surveillance program. It is not intended to diagnose MRSA infection nor to guide or monitor treatment for MRSA infections.      Labs: Basic Metabolic Panel:  Recent Labs Lab 08/20/15 2121 08/22/15 0553 08/24/15 0304  NA 133* 132* 135  K 4.2 4.5 4.0  CL 99* 96* 100*  CO2 GLUCOSE 125* 149* 125*  BUN CREATININE 0.71 0.68 0.50*  CALCIUM 8.9 9.3 8.9   Liver Function Tests: No results for input(s): AST, ALT, ALKPHOS, BILITOT, PROT, ALBUMIN in the last 168 hours. No results for input(s): LIPASE, AMYLASE in the last 168 hours. No results for input(s): AMMONIA in the last 168 hours. CBC:  Recent Labs Lab 08/20/15 2121 08/22/15 0553 08/24/15 0304  WBC 12.5* 9.8 10.8*  NEUTROABS 7.4  --   --   HGB  13.6 13.1 13.3  HCT 42.0 40.5 40.7  MCV 83.8 83.3 83.1  PLT 237 221 239   Cardiac Enzymes: No results for input(s): CKTOTAL, CKMB, CKMBINDEX, TROPONINI in the last 168 hours. BNP: BNP (last 3 results) No results for input(s): BNP in the last 8760 hours.  ProBNP (last 3 results) No results for input(s): PROBNP in the last 8760 hours.  CBG:  Recent Labs Lab 08/23/15 1216 08/23/15 1704 08/23/15 2149 08/24/15 0758 08/24/15 1215  GLUCAP 139* 121* 144* 120* 96  SignedEdsel Petrin  [facility] 08/24/2015, 3:37 PM

## 2015-09-07 ENCOUNTER — Institutional Professional Consult (permissible substitution): Payer: Self-pay | Admitting: Pulmonary Disease

## 2015-09-22 ENCOUNTER — Institutional Professional Consult (permissible substitution): Payer: Self-pay | Admitting: Pulmonary Disease

## 2015-09-24 ENCOUNTER — Institutional Professional Consult (permissible substitution): Payer: Self-pay | Admitting: Pulmonary Disease

## 2015-09-24 ENCOUNTER — Institutional Professional Consult (permissible substitution): Payer: Self-pay | Admitting: Internal Medicine

## 2015-11-25 ENCOUNTER — Encounter: Payer: Self-pay | Admitting: Pulmonary Disease

## 2015-11-25 ENCOUNTER — Ambulatory Visit (INDEPENDENT_AMBULATORY_CARE_PROVIDER_SITE_OTHER): Payer: Medicaid Other | Admitting: Pulmonary Disease

## 2015-11-25 VITALS — BP 122/80 | HR 71 | Temp 98.3°F | Ht 62.0 in | Wt 202.2 lb

## 2015-11-25 DIAGNOSIS — Z8709 Personal history of other diseases of the respiratory system: Secondary | ICD-10-CM | POA: Insufficient documentation

## 2015-11-25 DIAGNOSIS — Z87898 Personal history of other specified conditions: Secondary | ICD-10-CM

## 2015-11-25 NOTE — Progress Notes (Signed)
Subjective:     Patient ID: Garrett Shaffer, male   DOB: [birth year], 30 y.o.   MRN: [id number]  HPI ~  November 24, 2014:  Initial pulmonary consult w/ SN>        Garrett Shaffer is a 30 y/o gentleman w/ hx cerebral palsy, mental retardation, bipolar disorder, and he resides in a group home [facility]) where he is well cared for by the staff;  His PCP was Dr. Germaine Pomfret, and now sees Dr. Virl Son at [facility] on [address]...       He was [facility] 10/7 - 08/24/2015 by [facility] after presenting w/ sudden onset of SOB/ stridor/ wheezing c/w an allergic reaction/ anaphylaxis but they had no clue what the precipitating cause was related to;  He was given Epi in the Er w/ improvement;  He is a never smoker & has no hx of asthma, prev breathing problems, or known allergic or similar reactions in the past;  Eval was non-revealing w/ clear CXR, neg neck films, Labs OK, neg speech path eval, neg CCM & ENT consults (fiberoptic laryngoscopy wnl);  Treated w/ Decadron/Pred, and disch on NEBS w/ Duoneb & Budesonide...  Since disch he has been back to baseline, no problems noted and they have not used the NEB-- neither have they been able to identify any cause for the reaction...  Note-- pt & staff denies snoring, he rests well, wakes refreshed, no daytime sleepiness issues & does not take naps...   EXAM shows Afeb, VSS, O2sat=98% on RA;  HEENT- neg, mallampati3;  Chest- clear w/o w/r/r;  Heart- RR w/o m/r/g;  Abd- soft, nontender, neg;  Ext- neg w/o c/c/e;  Neuro- he has CP/ MR/ hx seizures/ hx bipolar illness...  Hosp records> H&P, DC Summary, Consult notes, XRays, Labs, etc all reviewed by me... IMP/PLAN>>  No one was able to determine the etiology of the sudden allergic reaction/ anaphylaxis episode, and he has never had similar problem before or since;  We discussed the poss of a thorough outpt allergy evaluation (IgE, RAST, skin testing, etc) but they prefer conservative approach & promise to call for any  problems in the future so we can set this up for him...     Past Medical History  Diagnosis Date  . Mental retardation    . Bipolar 1 disorder (HCC) >> on Gabapentin600-2Qhs, Abilify15/d   . Intermittent explosive disorder >> on Depakote500-2Qhs, Trazodone50Qhs   . Cerebral palsy (HCC) >> on Klonopin 0.5Qhs prn sleep   . Seizures (HCC) >> on Tegretol200Bid     No past surgical history on file.   Outpatient Encounter Prescriptions as of 11/25/2015  Medication Sig  . ARIPiprazole (ABILIFY) 15 MG tablet Take 15 mg by mouth daily.    . budesonide (PULMICORT) 0.25 MG/2ML nebulizer solution Take 2 mLs (0.25 mg total) by nebulization 2 (two) times daily. (Patient taking differently: Take 0.25 mg by nebulization 2 (two) times daily as needed. )  . carbamazepine (TEGRETOL) 200 MG tablet Take 200 mg by mouth 2 (two) times daily.    . clonazePAM (KLONOPIN) 0.5 MG tablet Take 0.5 mg by mouth at bedtime as needed. FOR SLEEP  . divalproex (DEPAKOTE) 500 MG DR tablet Take 1,000 mg by mouth at bedtime.   . fluticasone (FLONASE) 50 MCG/ACT nasal spray Place 2 sprays into both nostrils daily. (Patient taking differently: Place 2 sprays into both nostrils daily as needed. )  . gabapentin (NEURONTIN) 600 MG tablet Take 1,200 mg by mouth  at bedtime.   . pantoprazole (PROTONIX) 40 MG tablet Take 1 tablet (40 mg total) by mouth 2 (two) times daily. (Patient taking differently: Take 40 mg by mouth daily. )  . traZODone (DESYREL) 50 MG tablet Take 25 mg by mouth at bedtime.    No facility-administered encounter medications on file as of 11/25/2015.    No Known Allergies   Immunization History  Administered Date(s) Administered  . Influenza Whole 09/24/2007  . Influenza,inj,Quad PF,36+ Mos 08/22/2015  . Td 04/09/2007    No family history on file.   Social History   Social History  . Marital Status: Single    Spouse Name: N/A  . Number of Children: N/A  . Years of Education: N/A   Occupational  History  . Disabled     lives [facility]   Social History Main Topics  . Smoking status: Never Smoker   . Smokeless tobacco: Never Used  . Alcohol Use: No     Comment: Pt denies   . Drug Use: No     Comment: Pt denies  . Sexual Activity: No   Other Topics Concern  . Not on file   Social History Narrative    Current Medications, Allergies, Past Medical History, Past Surgical History, Family History, and Social History were reviewed in [facility] electronic medical record.   Review of Systems          All symptoms NEG except where BOLDED >> Care giver helped w/ responses. Constitutional:  F/C/S, fatigue, anorexia, unexpected weight change. HEENT:  HA, visual changes, hearing loss, earache, nasal symptoms, sore throat, mouth sores, hoarseness. Resp:  cough, sputum, hemoptysis; SOB, tightness, wheezing. Cardio:  CP, palpit, DOE, orthopnea, edema. GI:  N/V/D/C, blood in stool; reflux, abd pain, distention, gas. GU:  dysuria, freq, urgency, hematuria, flank pain, voiding difficulty. MS:  joint pain, swelling, tenderness, decr ROM; neck pain, back pain, etc. Neuro:  HA, tremors, seizures, dizziness, syncope, weakness, numbness, gait abn. Skin:  suspicious lesions or skin rash. Heme:  adenopathy, bruising, bleeding. Psyche:  confusion, agitation, sleep disturbance, hallucinations, anxiety, depression suicidal.   Objective:   Physical Exam       Vital Signs:  Reviewed...  General:  WD, WN, 30 y/o BM in NAD; alert, pleasant & cooperative... HEENT:  [place]/AT; Conjunctiva- pink, Sclera- nonicteric, EOM-wnl, PERRLA, EACs-clear, TMs-wnl; NOSE-clear; THROAT-clear & wnl. Neck:  Supple w/ full ROM; no JVD; normal carotid impulses w/o bruits; no thyromegaly or nodules palpated; no lymphadenopathy. Chest:  Clear to P & A; without wheezes, rales, or rhonchi heard. Heart:  Regular Rhythm; norm S1 & S2 without murmurs, rubs, or gallops detected. Abdomen:  Soft & nontender- no  guarding or rebound; normal bowel sounds; no organomegaly or masses palpated. Ext:  Normal ROM; without deformities or arthritic changes; no varicose veins, venous insuffic, or edema;  Pulses intact w/o bruits. Neuro:  CNs II-XII intact; motor testing normal; sensory testing normal; gait normal & balance OK. Derm:  No lesions noted; no rash etc. Lymph:  No cervical, supraclavicular, axillary, or inguinal adenopathy palpated.  Assessment:      IMP >>    ?sudden allergic reaction 08/20/15 unknown etiology characterized by SOB, stridor, wheezing=> resolved w/ Epinephrine shot in ER & Hosp for Decadron/Pred/ Nebs=> no prev episodes and no recurrent episodes to date...    Hx cerebral palsy, mental retardation, seizures, bipolar disorder, intermittent explosive disorder  PLAN >>    No one was able to determine the etiology  of the sudden allergic reaction/ anaphylaxis episode, and he has never had similar problem before or since;  We discussed the poss of a thorough outpt allergy evaluation (IgE, RAST, skin testing, etc) but they prefer conservative approach & promise to call for any problems in the future so we can set this up for him.     Plan:     Patient's Medications  New Prescriptions   No medications on file  Previous Medications   ARIPIPRAZOLE (ABILIFY) 15 MG TABLET    Take 15 mg by mouth daily.     BUDESONIDE (PULMICORT) 0.25 MG/2ML NEBULIZER SOLUTION    Take 2 mLs (0.25 mg total) by nebulization 2 (two) times daily.   CARBAMAZEPINE (TEGRETOL) 200 MG TABLET    Take 200 mg by mouth 2 (two) times daily.     CLONAZEPAM (KLONOPIN) 0.5 MG TABLET    Take 0.5 mg by mouth at bedtime as needed. FOR SLEEP   DIVALPROEX (DEPAKOTE) 500 MG DR TABLET    Take 1,000 mg by mouth at bedtime.    FLUTICASONE (FLONASE) 50 MCG/ACT NASAL SPRAY    Place 2 sprays into both nostrils daily.   GABAPENTIN (NEURONTIN) 600 MG TABLET    Take 1,200 mg by mouth at bedtime.    PANTOPRAZOLE (PROTONIX) 40 MG TABLET    Take  1 tablet (40 mg total) by mouth 2 (two) times daily.   TRAZODONE (DESYREL) 50 MG TABLET    Take 25 mg by mouth at bedtime.   Modified Medications   No medications on file  Discontinued Medications   No medications on file

## 2015-11-25 NOTE — Patient Instructions (Signed)
Lorin PicketScott- it was nice meeting you today...  We are pleased that your previous breathing problem has resolved and you are doing so well...  You may use the NEBULIZER w/ the DUONEB medication as needed for shortness or breath or wheezing...  I'd like to see you increase your exercise & work on weight reduction...  Call for any questions or if you have any future problem w/ your breathing.Marland Kitchen..Marland Kitchen

## 2017-09-01 ENCOUNTER — Emergency Department (HOSPITAL_COMMUNITY)
Admission: EM | Admit: 2017-09-01 | Discharge: 2017-09-02 | Disposition: A | Discharge status: Home / Self Care | Payer: Medicaid Other | Attending: Emergency Medicine | Admitting: Emergency Medicine

## 2017-09-01 ENCOUNTER — Encounter (HOSPITAL_COMMUNITY): Payer: Self-pay | Admitting: *Deleted

## 2017-09-01 DIAGNOSIS — J988 Other specified respiratory disorders: Secondary | ICD-10-CM | POA: Diagnosis not present

## 2017-09-01 DIAGNOSIS — F70 Mild intellectual disabilities: Secondary | ICD-10-CM | POA: Diagnosis not present

## 2017-09-01 DIAGNOSIS — R45851 Suicidal ideations: Secondary | ICD-10-CM | POA: Diagnosis not present

## 2017-09-01 DIAGNOSIS — Z593 Problems related to living in residential institution: Secondary | ICD-10-CM | POA: Diagnosis not present

## 2017-09-01 DIAGNOSIS — F4325 Adjustment disorder with mixed disturbance of emotions and conduct: Secondary | ICD-10-CM | POA: Diagnosis not present

## 2017-09-01 LAB — BASIC METABOLIC PANEL
ANION GAP: 11 (ref 5–15)
BUN: 13 mg/dL (ref 6–20)
CHLORIDE: 103 mmol/L (ref 101–111)
CO2: 27 mmol/L (ref 22–32)
Calcium: 9.5 mg/dL (ref 8.9–10.3)
Creatinine, Ser: 0.8 mg/dL (ref 0.61–1.24)
GFR calc non Af Amer: >60 mL/min (ref >60)
Glucose, Bld: 105 mg/dL — ABNORMAL HIGH (ref 65–99)
POTASSIUM: 4.3 mmol/L (ref 3.5–5.1)
Sodium: 141 mmol/L (ref 135–145)

## 2017-09-01 LAB — CBC WITH DIFFERENTIAL/PLATELET
Basophils Absolute: 0 10*3/uL (ref 0.0–0.1)
Basophils Relative: 0 %
Eosinophils Absolute: 0 10*3/uL (ref 0.0–0.7)
Eosinophils Relative: 0 %
HCT: 43.1 % (ref 39.0–52.0)
HEMOGLOBIN: 14.1 g/dL (ref 13.0–17.0)
LYMPHS ABS: 1.7 10*3/uL (ref 0.7–4.0)
LYMPHS PCT: 15 %
MCH: 27.4 pg (ref 26.0–34.0)
MCHC: 32.7 g/dL (ref 30.0–36.0)
MCV: 83.9 fL (ref 78.0–100.0)
MONOS PCT: 6 %
Monocytes Absolute: 0.6 10*3/uL (ref 0.1–1.0)
NEUTROS PCT: 79 %
Neutro Abs: 8.8 10*3/uL — ABNORMAL HIGH (ref 1.7–7.7)
Platelets: 241 10*3/uL (ref 150–400)
RBC: 5.14 MIL/uL (ref 4.22–5.81)
RDW: 13.6 % (ref 11.5–15.5)
WBC: 11.2 10*3/uL — ABNORMAL HIGH (ref 4.0–10.5)

## 2017-09-01 LAB — RAPID URINE DRUG SCREEN, HOSP PERFORMED
Amphetamines: NOT DETECTED
BARBITURATES: NOT DETECTED
BENZODIAZEPINES: NOT DETECTED
COCAINE: NOT DETECTED
OPIATES: NOT DETECTED
TETRAHYDROCANNABINOL: NOT DETECTED

## 2017-09-01 LAB — ETHANOL: Alcohol, Ethyl (B): <10 mg/dL (ref <10)

## 2017-09-01 LAB — SALICYLATE LEVEL: Salicylate Lvl: <7 mg/dL (ref 2.8–30.0)

## 2017-09-01 LAB — ACETAMINOPHEN LEVEL: Acetaminophen (Tylenol), Serum: <10 ug/mL — ABNORMAL LOW (ref 10–30)

## 2017-09-01 MED ORDER — ALUM & MAG HYDROXIDE-SIMETH 200-200-20 MG/5ML PO SUSP: 30.0000 mL | Freq: Four times a day (QID) | ORAL | Status: DC | PRN

## 2017-09-01 MED ORDER — CARBAMAZEPINE 200 MG PO TABS
200.0000 mg | ORAL_TABLET | Freq: Two times a day (BID) | ORAL | Status: DC
  Administered 2017-09-01 – 2017-09-02 (×2): 200 mg via ORAL
  Filled 2017-09-01 (×2): qty 1

## 2017-09-01 MED ORDER — BUDESONIDE 0.25 MG/2ML IN SUSP
0.2500 mg | Freq: Two times a day (BID) | RESPIRATORY_TRACT | Status: DC
  Administered 2017-09-01: 0.25 mg via RESPIRATORY_TRACT
  Filled 2017-09-01 (×2): qty 2

## 2017-09-01 MED ORDER — DIVALPROEX SODIUM 500 MG PO DR TAB
1000.0000 mg | DELAYED_RELEASE_TABLET | Freq: Every day | ORAL | Status: DC
  Administered 2017-09-01: 1000 mg via ORAL
  Filled 2017-09-01: qty 2

## 2017-09-01 MED ORDER — CLONAZEPAM 0.5 MG PO TABS
0.5000 mg | ORAL_TABLET | Freq: Every day | ORAL | Status: DC
  Administered 2017-09-01: 0.5 mg via ORAL
  Filled 2017-09-01: qty 1

## 2017-09-01 MED ORDER — ARIPIPRAZOLE 5 MG PO TABS
15.0000 mg | ORAL_TABLET | Freq: Every day | ORAL | Status: DC
  Administered 2017-09-01 – 2017-09-02 (×2): 15 mg via ORAL
  Filled 2017-09-01 (×2): qty 1

## 2017-09-01 MED ORDER — IBUPROFEN 200 MG PO TABS
600.0000 mg | ORAL_TABLET | Freq: Three times a day (TID) | ORAL | Status: DC | PRN
  Administered 2017-09-01: 600 mg via ORAL
  Filled 2017-09-01: qty 3

## 2017-09-01 MED ORDER — GABAPENTIN 400 MG PO CAPS
1200.0000 mg | ORAL_CAPSULE | Freq: Every day | ORAL | Status: DC
  Administered 2017-09-01: 1200 mg via ORAL
  Filled 2017-09-01: qty 3

## 2017-09-01 MED ORDER — FLUTICASONE PROPIONATE 50 MCG/ACT NA SUSP
2.0000 | Freq: Every day | NASAL | Status: DC
  Filled 2017-09-01: qty 16

## 2017-09-01 MED ORDER — ONDANSETRON HCL 4 MG PO TABS
4.0000 mg | ORAL_TABLET | Freq: Three times a day (TID) | ORAL | Status: DC | PRN
Start: 2017-09-01 — End: 2017-09-02

## 2017-09-01 MED ORDER — PANTOPRAZOLE SODIUM 40 MG PO TBEC
40.0000 mg | DELAYED_RELEASE_TABLET | Freq: Every day | ORAL | Status: DC
  Administered 2017-09-01 – 2017-09-02 (×2): 40 mg via ORAL
  Filled 2017-09-01 (×2): qty 1

## 2017-09-01 MED ORDER — TRAZODONE HCL 50 MG PO TABS
25.0000 mg | ORAL_TABLET | Freq: Every day | ORAL | Status: DC
  Administered 2017-09-01: 25 mg via ORAL
  Filled 2017-09-01: qty 1

## 2017-09-01 NOTE — ED Notes (Signed)
GROUP HOME STAFF MEMBER: Graylin ShiverWILLIAM MOORE [phone]
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
## 2017-09-01 NOTE — BH Assessment (Signed)
[facility] Assessment Progress Note   Per Nira ConnJason Berry, NP pt is recommended for continued observation and reassessment in AM due to pt's impulsivity and threats made to police officers PTA. EDP Dr. Jacqulyn BathLong notified and in agreement with disposition. [facility] [place].   Garrett Shaffer, MSW, LCSW Therapeutic Triage Specialist  [phone]
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
## 2017-09-01 NOTE — ED Notes (Signed)
IVC PAPERWORK RECEIVED AND PLACED IN THE CHART.

## 2017-09-02 DIAGNOSIS — Z593 Problems related to living in residential institution: Secondary | ICD-10-CM | POA: Diagnosis not present

## 2017-09-02 DIAGNOSIS — F4325 Adjustment disorder with mixed disturbance of emotions and conduct: Secondary | ICD-10-CM | POA: Diagnosis not present

## 2017-09-02 NOTE — Progress Notes (Signed)
CSW spoke with Geanie Berlinbijah Shealey [phone], who is QP of family care home, to notify them of patients discharge. Mr. Ardis HughsShealey will pick patient up ASAP. [facility].   Stacy GardnerErin Nickcole Bralley, [facility] Emergency Room Clinical Social Worker [phone]

## 2017-09-02 NOTE — ED Notes (Signed)
[facility] Expected date:  Expected time:  Means of arrival:  Comments: [id number]

## 2017-09-02 NOTE — ED Notes (Signed)
Pt was discharged, but when pt ride came pt became very tearful and anxious. Social Music therapistworker and RN with pt at time. Pt states that the people from the group home were mad at him and he was scared to go.  The people who came were very calm and understanding and called "Garrett Shaffer" who the patient is more comfortable with. Pt assisted back to [place] [facility] and given a lunch tray while waiting for Garrett Misha

## 2017-09-02 NOTE — Consult Note (Signed)
[facility] Face-to-Face Psychiatry Consult   Reason for Consult:  Upset at his group home and ran into traffic Referring Physician:  EDP Patient Identification: Garrett Shaffer MRN:  [id number] Principal Diagnosis: Adjustment disorder with mixed disturbance of emotions and conduct Diagnosis:   Patient Active Problem List   Diagnosis Date Noted  . Adjustment disorder with mixed disturbance of emotions and conduct [F43.25] 09/02/2017    Priority: High  . History of dyspnea [Z87.09] 11/25/2015  . Acute respiratory distress [R06.03]   . Leukocytosis [D72.829]   . Lactic acidosis [E87.2]   . Bipolar 1 disorder, mixed, moderate ([place]) [F31.62]   . Hypoxia [R09.02] 08/21/2015  . Anaphylactic reaction [T78.2XXA] 08/21/2015  . Airway obstruction [J98.8]   . BRONCHITIS, ACUTE [J20.9] 12/17/2007  . GAIT DISTURBANCE [R26.9] 08/20/2007  . DISORDER, BIPOLAR NOS [F31.9] 06/20/2007  . 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 RETARDATION, MENTAL, MODERATE [F71] 06/20/2007  . 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 SEIZURE DISORDER [R56.9] 06/20/2007    Total Time spent with patient: 45 minutes  Subjective:   Garrett Shaffer is a 31 y.o. male patient does not warrant admission.  HPI:  31 yo male who presented to the ED after getting upset at his group home and running into traffic.  On assessment, he was calm and smiling.  Denies suicidal/homicidal ideations, hallucinations, or substance abuse.  Limited cognitive abilities but no agitation and agreeable to return to his group home.  Stable for discharge.  Past Psychiatric History: IED, bipolar d/o  Risk to Self: Suicidal Ideation: No Suicidal Intent: No Is patient at risk for suicide?: No Suicidal Plan?: No Access to Means: No What has been your use of drugs/alcohol within the last 12 months?: denies  Triggers for Past Attempts: None known Intentional Self  Injurious Behavior: None Risk to Others: None Prior Inpatient Therapy: Prior Inpatient Therapy: No Prior Outpatient Therapy: Prior Outpatient Therapy: No Does patient have an ACCT team?: No Does patient have Intensive In-House Services?  : No Does patient have [facility] services? : No Does patient have P4CC services?: No  Past Medical History:  Past Medical History:  Diagnosis Date  . Bipolar 1 disorder ([place])   . Cerebral palsy ([place])   . Intermittent explosive disorder   . Mental retardation   . Seizures ([place])    History reviewed. No pertinent surgical history. Family History:  Family History  Problem Relation Age of Onset  . Family history unknown: Yes   Family Psychiatric  History: unknown Social History:  History  Alcohol Use No    Comment: Pt denies      History  Drug Use No    Comment: Pt denies    Social History   Social History  . Marital status: Single    Spouse name: N/A  . Number of children: N/A  . Years of education: N/A   Occupational History  . Disabled     lives in [facility]   Social History Main Topics  . Smoking status: Never Smoker  . Smokeless tobacco: Never Used  . Alcohol use No     Comment: Pt denies   . Drug use: No     Comment: Pt denies  . Sexual activity: No   Other Topics Concern  . None   Social History Narrative  . None   Additional Social History:    Allergies:  No Known Allergies  Labs:  Results for  orders placed or performed during the hospital encounter of 09/01/17 (from the past 48 hour(s))  Basic metabolic panel     Status: Abnormal   Collection Time: 09/01/17  6:50 PM  Result Value Ref Range   Sodium 141 135 - 145 mmol/L   Potassium 4.3 3.5 - 5.1 mmol/L   Chloride 103 101 - 111 mmol/L   CO2 27 22 - 32 mmol/L   Glucose, Bld 105 (H) 65 - 99 mg/dL   BUN 13 6 - 20 mg/dL   Creatinine, Ser 0.80 0.61 - 1.24 mg/dL   Calcium 9.5 8.9 - 10.3 mg/dL   GFR calc non Af Amer >60 >60 mL/min   GFR calc Af Amer  >60 >60 mL/min    Comment: (NOTE) The eGFR has been calculated using the CKD EPI equation. This calculation has not been validated in all clinical situations. eGFR's persistently <60 mL/min signify possible Chronic Kidney Disease.    Anion gap 11 5 - 15  CBC with Differential     Status: Abnormal   Collection Time: 09/01/17  6:50 PM  Result Value Ref Range   WBC 11.2 (H) 4.0 - 10.5 K/uL   RBC 5.14 4.22 - 5.81 MIL/uL   Hemoglobin 14.1 13.0 - 17.0 g/dL   HCT 43.1 39.0 - 52.0 %   MCV 83.9 78.0 - 100.0 fL   MCH 27.4 26.0 - 34.0 pg   MCHC 32.7 30.0 - 36.0 g/dL   RDW 13.6 11.5 - 15.5 %   Platelets 241 150 - 400 K/uL   Neutrophils Relative % 79 %   Neutro Abs 8.8 (H) 1.7 - 7.7 K/uL   Lymphocytes Relative 15 %   Lymphs Abs 1.7 0.7 - 4.0 K/uL   Monocytes Relative 6 %   Monocytes Absolute 0.6 0.1 - 1.0 K/uL   Eosinophils Relative 0 %   Eosinophils Absolute 0.0 0.0 - 0.7 K/uL   Basophils Relative 0 %   Basophils Absolute 0.0 0.0 - 0.1 K/uL  Acetaminophen level     Status: Abnormal   Collection Time: 09/01/17  6:50 PM  Result Value Ref Range   Acetaminophen (Tylenol), Serum <10 (L) 10 - 30 ug/mL    Comment:        THERAPEUTIC CONCENTRATIONS VARY SIGNIFICANTLY. A RANGE OF 10-30 ug/mL MAY BE AN EFFECTIVE CONCENTRATION FOR MANY PATIENTS. HOWEVER, SOME ARE BEST TREATED AT CONCENTRATIONS OUTSIDE THIS RANGE. ACETAMINOPHEN CONCENTRATIONS >150 ug/mL AT 4 HOURS AFTER INGESTION AND >50 ug/mL AT 12 HOURS AFTER INGESTION ARE OFTEN ASSOCIATED WITH TOXIC REACTIONS.   Ethanol     Status: None   Collection Time: 09/01/17  6:50 PM  Result Value Ref Range   Alcohol, Ethyl (B) <10 <10 mg/dL    Comment:        LOWEST DETECTABLE LIMIT FOR SERUM ALCOHOL IS 10 mg/dL FOR MEDICAL PURPOSES ONLY   Salicylate level     Status: None   Collection Time: 09/01/17  6:50 PM  Result Value Ref Range   Salicylate Lvl <5.5 2.8 - 30.0 mg/dL  Urine rapid drug screen (hosp performed)     Status: None    Collection Time: 09/01/17  6:50 PM  Result Value Ref Range   Opiates NONE DETECTED NONE DETECTED   Cocaine NONE DETECTED NONE DETECTED   Benzodiazepines NONE DETECTED NONE DETECTED   Amphetamines NONE DETECTED NONE DETECTED   Tetrahydrocannabinol NONE DETECTED NONE DETECTED   Barbiturates NONE DETECTED NONE DETECTED    Comment:  DRUG SCREEN FOR MEDICAL PURPOSES ONLY.  IF CONFIRMATION IS NEEDED FOR ANY PURPOSE, NOTIFY LAB WITHIN 5 DAYS.        LOWEST DETECTABLE LIMITS FOR URINE DRUG SCREEN Drug Class       Cutoff (ng/mL) Amphetamine      1000 Barbiturate      200 Benzodiazepine   188 Tricyclics       416 Opiates          300 Cocaine          300 THC              50     Current Facility-Administered Medications  Medication Dose Route Frequency Provider Last Rate Last Dose  . alum & mag hydroxide-simeth (MAALOX/MYLANTA) 200-200-20 MG/5ML suspension 30 mL  30 mL Oral Q6H PRN Long, Wonda Olds, MD      . ARIPiprazole (ABILIFY) tablet 15 mg  15 mg Oral Daily Long, Wonda Olds, MD   15 mg at 09/02/17 1047  . budesonide (PULMICORT) nebulizer solution 0.25 mg  0.25 mg Nebulization BID Long, Wonda Olds, MD   0.25 mg at 09/01/17 2310  . carbamazepine (TEGRETOL) tablet 200 mg  200 mg Oral BID Long, Wonda Olds, MD   200 mg at 09/02/17 1047  . divalproex (DEPAKOTE) DR tablet 1,000 mg  1,000 mg Oral QHS Long, Wonda Olds, MD   1,000 mg at 09/01/17 2242  . fluticasone (FLONASE) 50 MCG/ACT nasal spray 2 spray  2 spray Each Nare Daily Long, Wonda Olds, MD      . gabapentin (NEURONTIN) capsule 1,200 mg  1,200 mg Oral QHS Long, Wonda Olds, MD   1,200 mg at 09/01/17 2242  . ibuprofen (ADVIL,MOTRIN) tablet 600 mg  600 mg Oral Q8H PRN Long, Wonda Olds, MD   600 mg at 09/01/17 2026  . ondansetron (ZOFRAN) tablet 4 mg  4 mg Oral Q8H PRN Long, Wonda Olds, MD      . pantoprazole (PROTONIX) EC tablet 40 mg  40 mg Oral Daily Long, Wonda Olds, MD   40 mg at 09/02/17 1048  . traZODone (DESYREL) tablet 25 mg  25 mg Oral QHS  Long, Wonda Olds, MD   25 mg at 09/01/17 2241   Current Outpatient Prescriptions  Medication Sig Dispense Refill  . ARIPiprazole (ABILIFY) 15 MG tablet Take 15 mg by mouth daily.      Marland Kitchen atorvastatin (LIPITOR) 20 MG tablet Take 20 mg by mouth daily.    . budesonide (PULMICORT) 0.25 MG/2ML nebulizer solution Take 2 mLs (0.25 mg total) by nebulization 2 (two) times daily. (Patient taking differently: Take 0.25 mg by nebulization 2 (two) times daily as needed. ) 60 mL 12  . carbamazepine (TEGRETOL) 200 MG tablet Take 200 mg by mouth 2 (two) times daily.      . chlorhexidine (PERIDEX) 0.12 % solution Use as directed 15 mLs in the mouth or throat 2 (two) times daily.    . clonazePAM (KLONOPIN) 0.5 MG tablet Take 0.5 mg by mouth at bedtime as needed for anxiety (sleep).     . divalproex (DEPAKOTE ER) 500 MG 24 hr tablet Take 1,000 mg by mouth at bedtime.    . fluticasone (FLONASE) 50 MCG/ACT nasal spray Place 2 sprays into both nostrils daily. (Patient taking differently: Place 2 sprays into both nostrils daily as needed for allergies. ) 16 g 0  . gabapentin (NEURONTIN) 600 MG tablet Take 1,200 mg by mouth at bedtime.     . Omega-3  Fatty Acids (FISH OIL) 1000 MG CAPS Take 1,000 mg by mouth daily.    . pantoprazole (PROTONIX) 40 MG tablet Take 1 tablet (40 mg total) by mouth 2 (two) times daily. (Patient taking differently: Take 40 mg by mouth daily. ) 60 tablet 0  . traZODone (DESYREL) 50 MG tablet Take 25 mg by mouth at bedtime.       Musculoskeletal: Strength & Muscle Tone: within normal limits Gait & Station: normal Patient leans: N/A  Psychiatric Specialty Exam: Physical Exam  Constitutional: He is oriented to person, place, and time. He appears well-developed and well-nourished.  HENT:  Head: Normocephalic.  Neck: Normal range of motion.  Respiratory: Effort normal.  Musculoskeletal: Normal range of motion.  Neurological: He is alert and oriented to person, place, and time.  Psychiatric:  He has a normal mood and affect. His speech is normal and behavior is normal. Judgment and thought content normal. Cognition and memory are normal.    Review of Systems  All other systems reviewed and are negative.   Blood pressure (!) 148/76, pulse 99, temperature 97.7 F (36.5 C), temperature source Oral, resp. rate 16, height _0  (1.626 m), weight 89.4 kg (197 lb), SpO2 98 %.Body mass index is 33.81 kg/m.  General Appearance: Casual  Eye Contact:  Good  Speech:  Normal Rate  Volume:  Normal  Mood:  Euthymic  Affect:  Congruent  Thought Process:  Coherent and Descriptions of Associations: Intact  Orientation:  Full (Time, Place, and Person)  Thought Content:  WDL and Logical  Suicidal Thoughts:  No  Homicidal Thoughts:  No  Memory:  Immediate;   Good Recent;   Good Remote;   Good  Judgement:  Fair  Insight:  Fair  Psychomotor Activity:  Normal  Concentration:  Concentration: Good and Attention Span: Good  Recall:  Garrett Shaffer of Knowledge:  Fair  Language:  Good  Akathisia:  No  Handed:  Right  AIMS (if indicated):     Assets:  Housing Leisure Time Physical Health Resilience Social Support  ADL's:  Intact  Cognition:  Impaired,  Moderate  Sleep:        Treatment Plan Summary: Daily contact with patient to assess and evaluate symptoms and progress in treatment, Medication management and Plan adjustment disorder with mixed disturbance of emotions and conduct:  -Crisis stabilization -Medication management:  Medical medications started along with Tegretol 200 mg BID for mood stabilization, Abilify 15 mg daily for mood stabilization, Depakote 1000 mg at bedtime for mood, and Trazodone 25 mg at bedtime for sleep. -Individual counseling  Disposition: No evidence of imminent risk to self or others at present.    Waylan Boga, NP 09/02/2017 11:48 AM   Patient seen face to face for this psychiatric evaluation along with Staff RN and physician extender, and formulated  appropriate treatment. Reviewed the information documented and agree with the treatment plan.  Manson Luckadoo 09/04/2017 12:01 PM

## 2017-09-02 NOTE — ED Notes (Signed)
Crackers and soda given to patient

## 2017-09-02 NOTE — [facility] Suicide Risk Assessment (Signed)
Suicide Risk Assessment  Discharge Assessment   [facility]   Principal Problem: Adjustment disorder with mixed disturbance of emotions and conduct Discharge Diagnoses:  Patient Active Problem List   Diagnosis Date Noted  . Adjustment disorder with mixed disturbance of emotions and conduct [F43.25] 09/02/2017    Priority: High  . History of dyspnea [Z87.09] 11/25/2015  . Acute respiratory distress [R06.03]   . Leukocytosis [D72.829]   . Lactic acidosis [E87.2]   . Bipolar 1 disorder, mixed, moderate ([facility]) [F31.62]   . Hypoxia [R09.02] 08/21/2015  . Anaphylactic reaction [T78.2XXA] 08/21/2015  . Airway obstruction [J98.8]   . BRONCHITIS, ACUTE [J20.9] 12/17/2007  . GAIT DISTURBANCE [R26.9] 08/20/2007  . DISORDER, BIPOLAR NOS [F31.9] 06/20/2007  . 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 RETARDATION, MENTAL, MODERATE [F71] 06/20/2007  . 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 SEIZURE DISORDER [R56.9] 06/20/2007    Total Time spent with patient: 45 minutes  Musculoskeletal: Strength & Muscle Tone: within normal limits Gait & Station: normal Patient leans: N/A  Psychiatric Specialty Exam: Physical Exam  Constitutional: He is oriented to person, place, and time. He appears well-developed and well-nourished.  HENT:  Head: Normocephalic.  Neck: Normal range of motion.  Respiratory: Effort normal.  Musculoskeletal: Normal range of motion.  Neurological: He is alert and oriented to person, place, and time.  Psychiatric: He has a normal mood and affect. His speech is normal and behavior is normal. Judgment and thought content normal. Cognition and memory are normal.    Review of Systems  All other systems reviewed and are negative.   Blood pressure (!) 148/76, pulse 99, temperature 97.7 F (36.5 C), temperature source Oral, resp. rate 16, height 5\' 4"   (1.626 m), weight 89.4 kg (197 lb), SpO2 98 %.Body mass index is 33.81 kg/m.  General Appearance: Casual  Eye Contact:  Good  Speech:  Normal Rate  Volume:  Normal  Mood:  Euthymic  Affect:  Congruent  Thought Process:  Coherent and Descriptions of Associations: Intact  Orientation:  Full (Time, Place, and Person)  Thought Content:  WDL and Logical  Suicidal Thoughts:  No  Homicidal Thoughts:  No  Memory:  Immediate;   Good Recent;   Good Remote;   Good  Judgement:  Fair  Insight:  Fair  Psychomotor Activity:  Normal  Concentration:  Concentration: Good and Attention Span: Good  Recall:  FiservFair  Fund of Knowledge:  Fair  Language:  Good  Akathisia:  No  Handed:  Right  AIMS (if indicated):     Assets:  Housing Leisure Time Physical Health Resilience Social Support  ADL's:  Intact  Cognition:  Impaired,  Moderate  Sleep:       Mental Status Per Nursing Assessment::   On Admission:   upset at his group home and ran into traffic  Demographic Factors:  Male and Adolescent or young adult  Loss Factors: NA  Historical Factors: Impulsivity  Risk Reduction Factors:   Sense of responsibility to family, Living with another person, especially a relative, Positive social support and Positive therapeutic relationship  Continued Clinical Symptoms:  None   Cognitive Features That Contribute To Risk:  None    Suicide Risk:  Minimal: No identifiable suicidal ideation.  Patients presenting with no risk factors but with morbid ruminations; may be classified as minimal risk based on the severity of the depressive symptoms    Plan Of Care/Follow-up  recommendations:  Activity:  as tolerated Diet:  heart healthy diet  LORD, JAMISON, NP 09/02/2017, 11:52 AM

## 2017-10-06 IMAGING — CT CT NECK W/ CM
4 of 5 series · 15 of 33 positions shown, 18 images · IV contrast (APPLIED)
Comparison: None.

CLINICAL DATA: Upper airway wheezing.

EXAM:
CT NECK WITH CONTRAST
TECHNIQUE: Multidetector CT imaging of the neck was performed using the
standard protocol following the bolus administration of intravenous
contrast.
CONTRAST:  75mL OMNIPAQUE IOHEXOL 300 MG/ML  SOLN

[Series 2: neck 2.0 i31s 3 · axial · 0.49mm/px · z∈[-285,-141]mm · 5 of 109 slices shown, 7 images]
[im 19/109  soft-tissue]
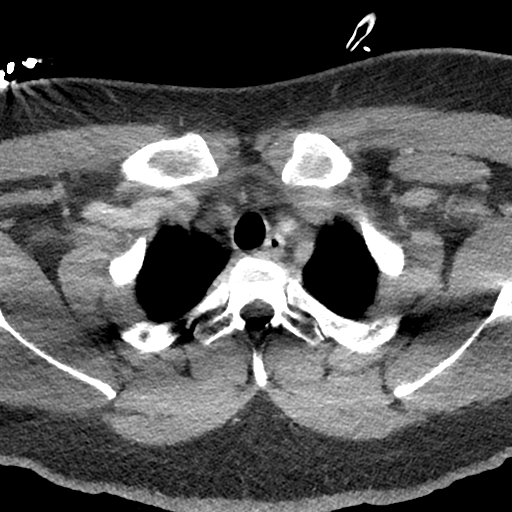
[im 19/109  bone]
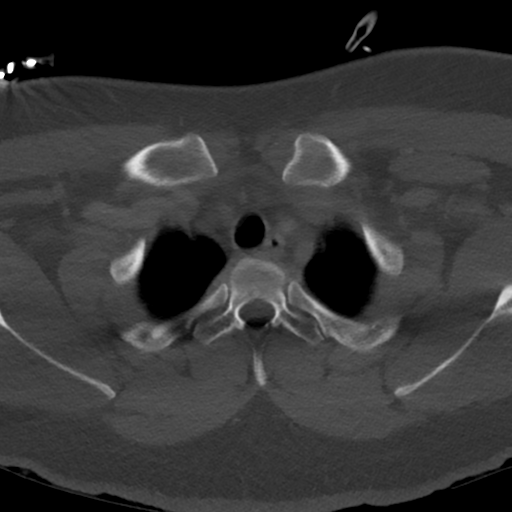
[im 37/109  bone]
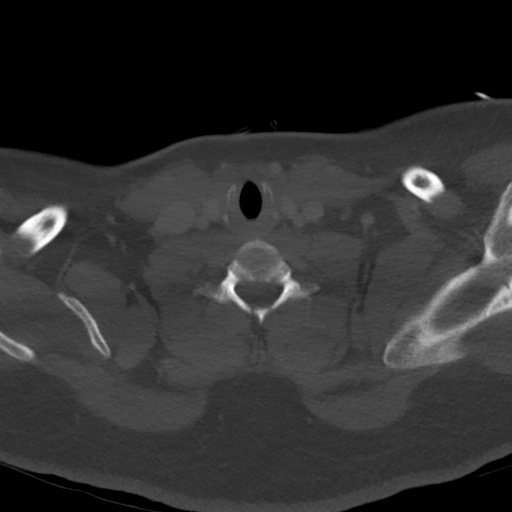
[im 55/109  bone]
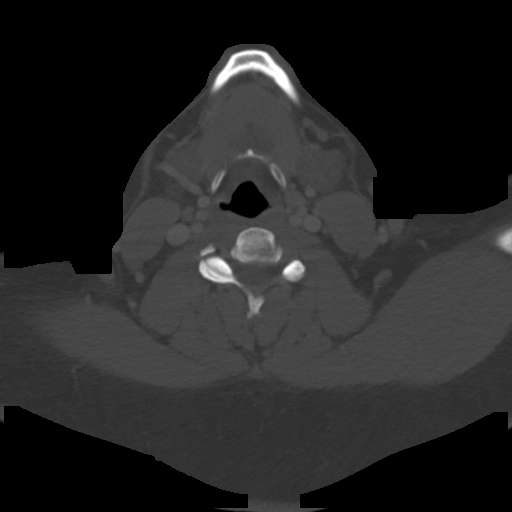
[im 73/109  bone]
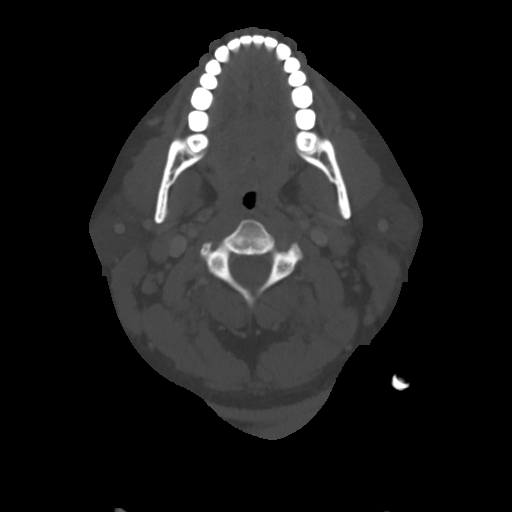
[im 91/109  soft-tissue]
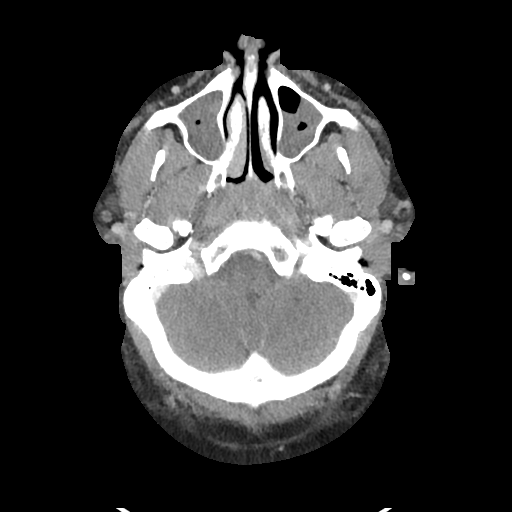
[im 91/109  bone]
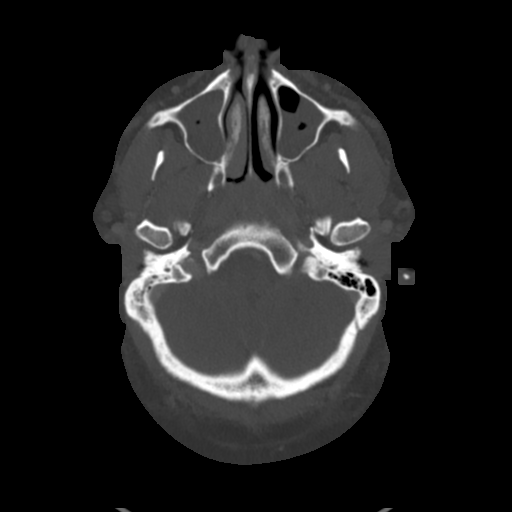

[Series 5: coronal st · coronal · 0.42mm/px · 3 of 110 slices shown]
[im 22/110  bone]
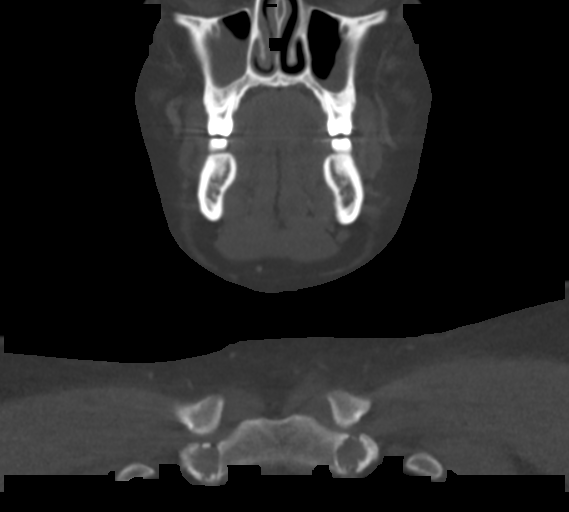
[im 44/110  bone]
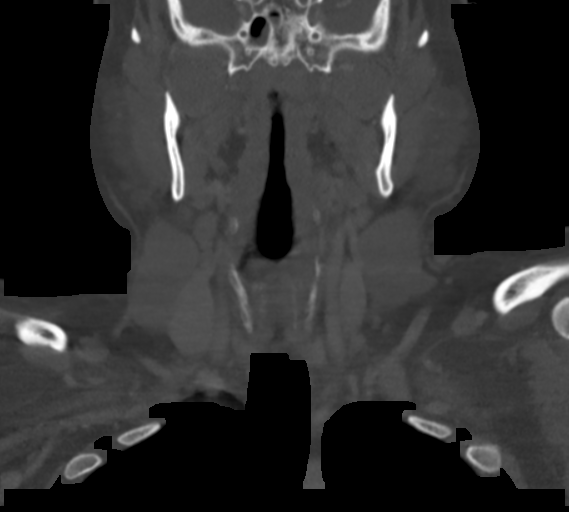
[im 66/110  bone]
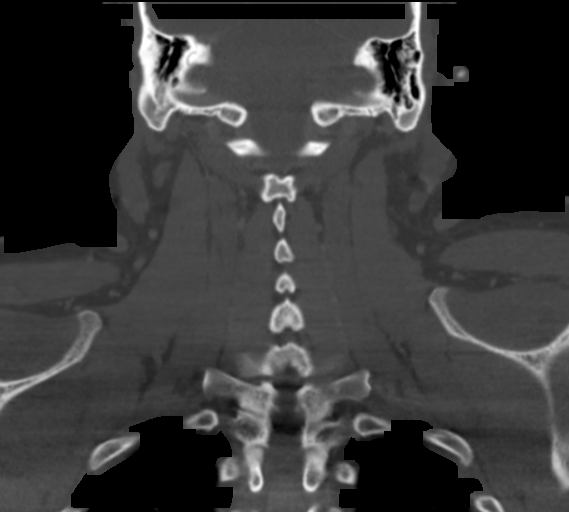

[Series 6: sagittal st · sagittal · 0.46mm/px · 5 of 105 slices shown, 6 images]
[im 35/105  bone]
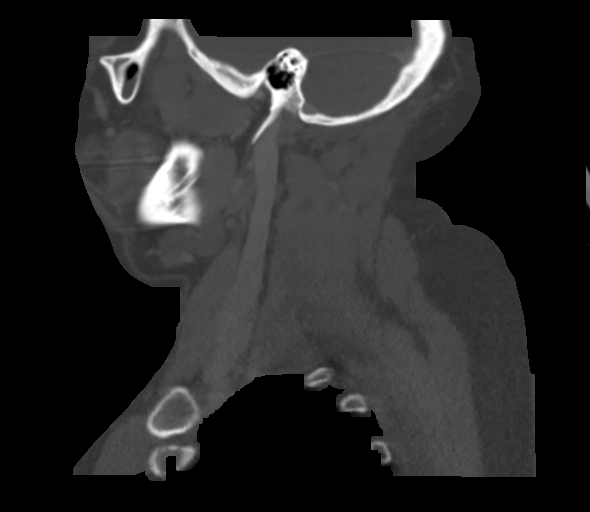
[im 44/105  bone]
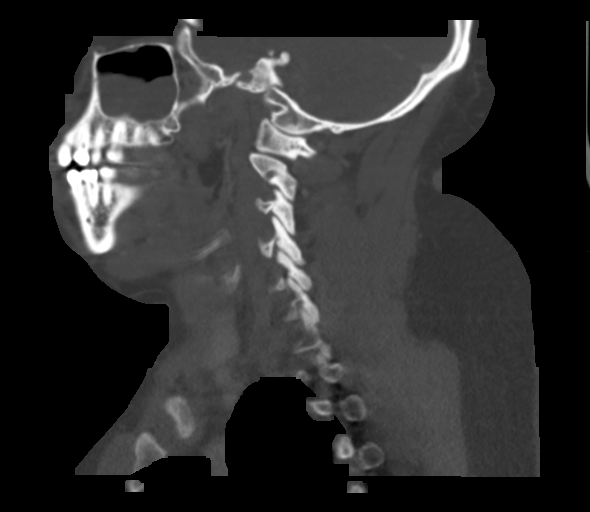
[im 53/105  soft-tissue]
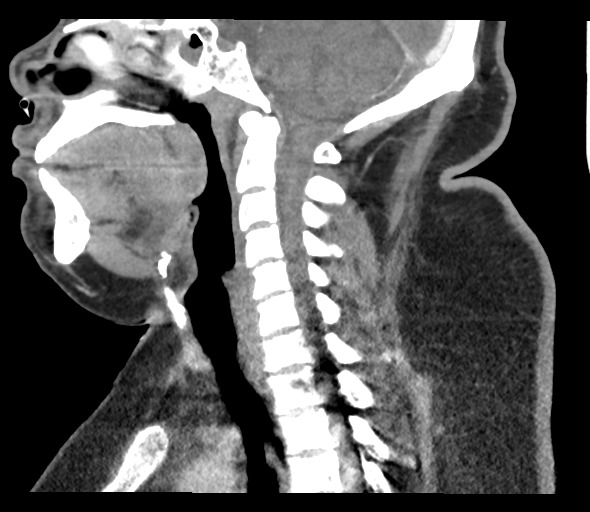
[im 53/105  bone]
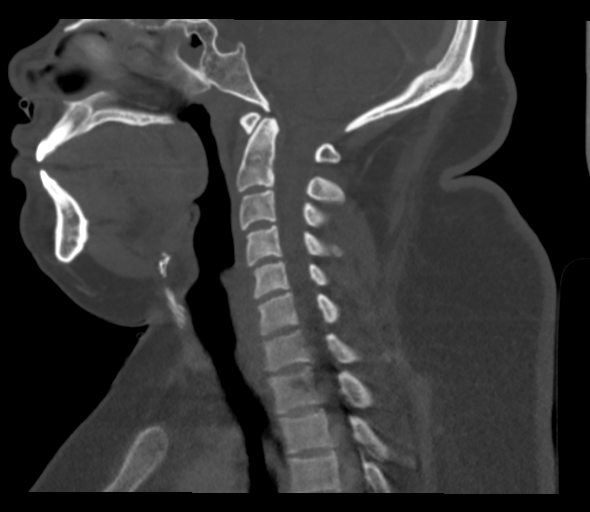
[im 61/105  bone]
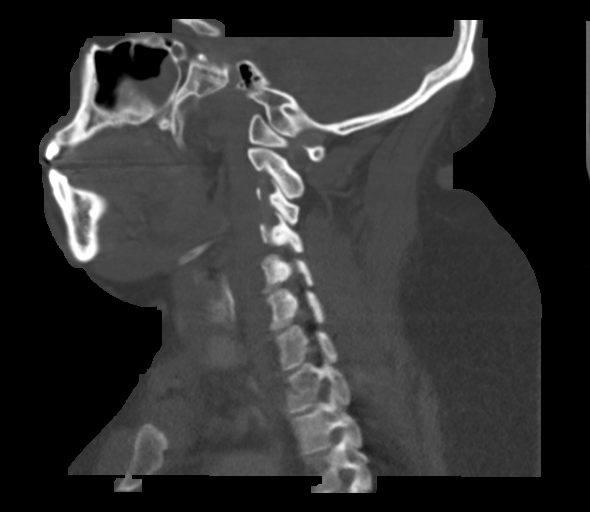
[im 70/105  bone]
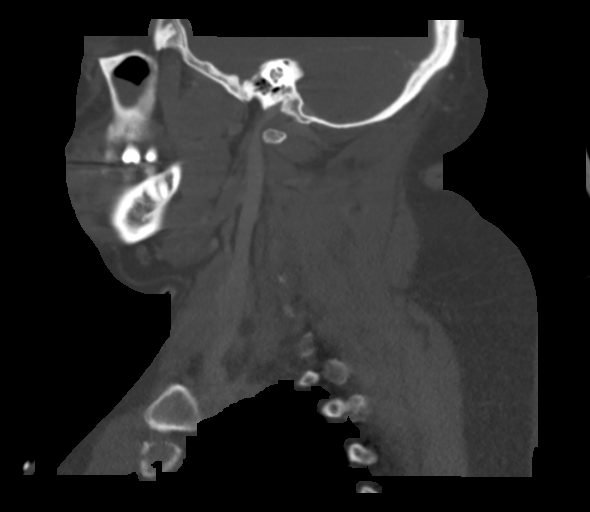

[Series 7: orthogonal st · axial · 0.38mm/px · z∈[-289,-246]mm · 2 of 87 slices shown]
[im 22/87  bone]
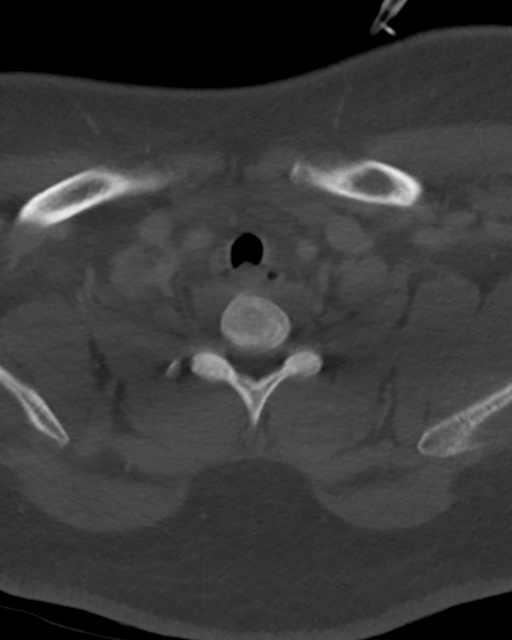
[im 44/87  bone]
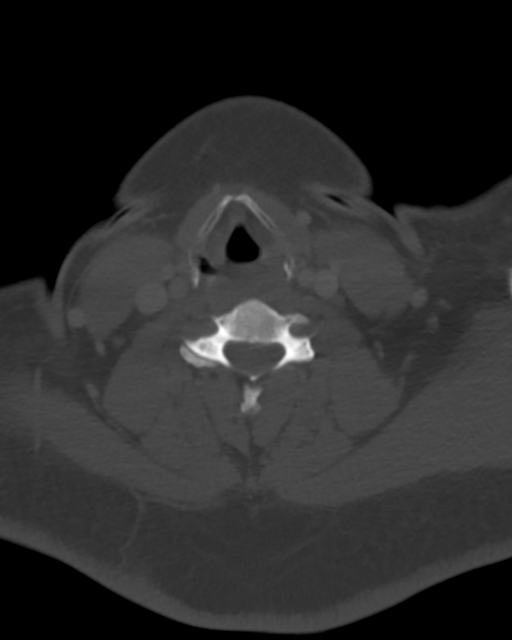

[15 of 33 positions shown; findings below may reference images not displayed]

FINDINGS: There is no lymphadenopathy. There is no nasopharyngeal or
oropharyngeal mass. There is no focal fluid collection. The airway
is patent. The visualized portions of the carotid arteries and
internal jugular veins are patent.

The visualized portions of the brain demonstrate no focal
abnormality.

There is significant polypoid mucosal thickening of bilateral
maxillary sinuses and ethmoid air cells. The mastoid air cells are
normally aerated.

The lung apices are clear.

There is no lytic or blastic osseous lesion.
IMPRESSION: No evidence of soft tissue abnormality within the neck. The airway
is patent.

Bilateral maxillary sinuses and ethmoid air cells polypoid mucosal
thickening.

## 2019-02-01 ENCOUNTER — Encounter (HOSPITAL_COMMUNITY): Payer: Self-pay | Admitting: *Deleted

## 2019-02-01 ENCOUNTER — Emergency Department (HOSPITAL_COMMUNITY)
Admission: EM | Admit: 2019-02-01 | Discharge: 2019-02-02 | Disposition: A | Discharge status: Home / Self Care | Payer: Medicaid Other | Attending: Emergency Medicine | Admitting: Emergency Medicine

## 2019-02-01 ENCOUNTER — Other Ambulatory Visit: Payer: Self-pay

## 2019-02-01 ENCOUNTER — Emergency Department (HOSPITAL_COMMUNITY): Payer: Medicaid Other

## 2019-02-01 DIAGNOSIS — R51 Headache: Secondary | ICD-10-CM | POA: Diagnosis not present

## 2019-02-01 DIAGNOSIS — F319 Bipolar disorder, unspecified: Secondary | ICD-10-CM | POA: Insufficient documentation

## 2019-02-01 DIAGNOSIS — Z046 Encounter for general psychiatric examination, requested by authority: Secondary | ICD-10-CM | POA: Diagnosis present

## 2019-02-01 DIAGNOSIS — Z79899 Other long term (current) drug therapy: Secondary | ICD-10-CM | POA: Insufficient documentation

## 2019-02-01 DIAGNOSIS — F69 Unspecified disorder of adult personality and behavior: Secondary | ICD-10-CM | POA: Diagnosis not present

## 2019-02-01 LAB — RAPID URINE DRUG SCREEN, HOSP PERFORMED
Amphetamines: NOT DETECTED
BENZODIAZEPINES: NOT DETECTED
Barbiturates: NOT DETECTED
COCAINE: NOT DETECTED
OPIATES: NOT DETECTED
Tetrahydrocannabinol: NOT DETECTED

## 2019-02-01 LAB — COMPREHENSIVE METABOLIC PANEL
ALBUMIN: 4.3 g/dL (ref 3.5–5.0)
ALT: 28 U/L (ref 0–44)
ANION GAP: 8 (ref 5–15)
AST: 28 U/L (ref 15–41)
Alkaline Phosphatase: 126 U/L (ref 38–126)
BILIRUBIN TOTAL: 0.4 mg/dL (ref 0.3–1.2)
BUN: 7 mg/dL (ref 6–20)
CO2: 26 mmol/L (ref 22–32)
Calcium: 9.4 mg/dL (ref 8.9–10.3)
Chloride: 102 mmol/L (ref 98–111)
Creatinine, Ser: 0.76 mg/dL (ref 0.61–1.24)
GFR calc non Af Amer: >60 mL/min (ref >60)
GLUCOSE: 101 mg/dL — AB (ref 70–99)
POTASSIUM: 4.3 mmol/L (ref 3.5–5.1)
SODIUM: 136 mmol/L (ref 135–145)
TOTAL PROTEIN: 8.7 g/dL — AB (ref 6.5–8.1)

## 2019-02-01 LAB — CBC WITH DIFFERENTIAL/PLATELET
Abs Immature Granulocytes: 0.02 10*3/uL (ref 0.00–0.07)
Basophils Absolute: 0 10*3/uL (ref 0.0–0.1)
Basophils Relative: 0 %
Eosinophils Absolute: 0 10*3/uL (ref 0.0–0.5)
Eosinophils Relative: 0 %
HEMATOCRIT: 45.7 % (ref 39.0–52.0)
HEMOGLOBIN: 14.7 g/dL (ref 13.0–17.0)
IMMATURE GRANULOCYTES: 0 %
LYMPHS ABS: 1.5 10*3/uL (ref 0.7–4.0)
Lymphocytes Relative: 20 %
MCH: 27.3 pg (ref 26.0–34.0)
MCHC: 32.2 g/dL (ref 30.0–36.0)
MCV: 84.9 fL (ref 80.0–100.0)
MONO ABS: 0.5 10*3/uL (ref 0.1–1.0)
MONOS PCT: 7 %
NEUTROS ABS: 5.5 10*3/uL (ref 1.7–7.7)
NEUTROS PCT: 73 %
Platelets: 244 10*3/uL (ref 150–400)
RBC: 5.38 MIL/uL (ref 4.22–5.81)
RDW: 13.4 % (ref 11.5–15.5)
WBC: 7.5 10*3/uL (ref 4.0–10.5)
nRBC: 0 % (ref 0.0–0.2)

## 2019-02-01 LAB — INFLUENZA PANEL BY PCR (TYPE A & B)
Influenza A By PCR: NEGATIVE
Influenza B By PCR: NEGATIVE

## 2019-02-01 LAB — ETHANOL: Alcohol, Ethyl (B): <10 mg/dL (ref <10)

## 2019-02-01 NOTE — ED Notes (Signed)
Security into wand pt, sheriff at bedside, pt handcuffed

## 2019-02-01 NOTE — ED Notes (Signed)
ED Provider at bedside. 

## 2019-02-01 NOTE — ED Notes (Signed)
Lajuana Ripple:  [phone].  Group home facility.   Please call tomorrow AM when patient is discharged and Mr. Laju[facility] Ripple will pick him up.

## 2019-02-01 NOTE — ED Provider Notes (Addendum)
[facility]-EMERGENCY DEPT Provider Note   CSN: [id number] Arrival date & time: 02/01/19  1831    History   Chief Complaint Chief Complaint  Patient presents with  . Medical Clearance    HPI Garrett Shaffer is a 33 y.o. male with hx of bipolar disorder, CP, MR, and h/o seizures, can provide limited history due to psychiatric disorder.  [employer] at bedside state that they received a hang-up 911 call from a group home.  When they arrived the patient stated that he wanted to leave the group home but would not give a specific reason why.  When they tried to get him to go back to the group home he became acutely agitated and was walking towards the road.  He is known to the police and has had aggressive behavior before.  When the police prevented him from going into the road he turned on them and tried to grab at scissors and the officers vest.  They ended up handcuffing him and he banged his head against the sidewalk several times.  There was a Child psychotherapist at the group home who did not know that he called 911 and could not give a reason why the patient wanted to leave.  The patient is cannot give a reason why he wanted to leave.  He denies being harmed or that anyone was trying to harm him.  IVC paperwork was filed due to his aggressive behavior.  Patient reports a headache but denies fever, chest pain, cough, shortness of breath, abdominal pain, nausea, vomiting, diarrhea, urinary symptoms.     HPI  Past Medical History:  Diagnosis Date  . Bipolar 1 disorder (HCC)   . Cerebral palsy (HCC)   . Intermittent explosive disorder   . Mental retardation   . Seizures [facility])     Patient Active Problem List   Diagnosis Date Noted  . 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 History of dyspnea 11/25/2015  . Acute respiratory distress   . Leukocytosis   . Lactic acidosis   . Bipolar 1 disorder, mixed, moderate (HCC)   . Hypoxia 08/21/2015   . Anaphylactic reaction 08/21/2015  . Airway obstruction   . BRONCHITIS, ACUTE 12/17/2007  . GAIT DISTURBANCE 08/20/2007  . DISORDER, BIPOLAR NOS 06/20/2007  . DISORDER, UNDRSC CONDUCT, AGR, MODERATE 06/20/2007  . DISORDER, INTERMITTENT EXPLOSIVE 06/20/2007  . RETARDATION, MENTAL, MODERATE 06/20/2007  . 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 SEIZURE DISORDER 06/20/2007    No past surgical history on file.      Home Medications    Prior to Admission medications   Medication Sig Start Date End Date Taking? Authorizing Provider  ARIPiprazole (ABILIFY) 15 MG tablet Take 15 mg by mouth daily.      [provider]  atorvastatin (LIPITOR) 20 MG tablet Take 20 mg by mouth daily.    [provider]  budesonide (PULMICORT) 0.25 MG/2ML nebulizer solution Take 2 mLs (0.25 mg total) by nebulization 2 (two) times daily. Patient taking differently: Take 0.25 mg by nebulization 2 (two) times daily as needed.  08/24/15   Mikhail, Nita Sells, DO  carbamazepine (TEGRETOL) 200 MG tablet Take 200 mg by mouth 2 (two) times daily.      [provider]  chlorhexidine (PERIDEX) 0.12 % solution Use as directed 15 mLs in the mouth or throat 2 (two) times daily.    [provider]  clonazePAM (KLONOPIN) 0.5 MG tablet Take 0.5 mg by mouth  at bedtime as needed for anxiety (sleep).     [provider]  divalproex (DEPAKOTE ER) 500 MG 24 hr tablet Take 1,000 mg by mouth at bedtime.    [provider]  fluticasone (FLONASE) 50 MCG/ACT nasal spray Place 2 sprays into both nostrils daily. Patient taking differently: Place 2 sprays into both nostrils daily as needed for allergies.  08/24/15   Mikhail, Nita Sells, DO  gabapentin (NEURONTIN) 600 MG tablet Take 1,200 mg by mouth at bedtime.     [provider]  Omega-3 Fatty Acids (FISH OIL) 1000 MG CAPS Take 1,000 mg by mouth daily.    [provider]  pantoprazole (PROTONIX) 40 MG tablet Take  1 tablet (40 mg total) by mouth 2 (two) times daily. Patient taking differently: Take 40 mg by mouth daily.  08/24/15   Edsel Petrin, DO  traZODone (DESYREL) 50 MG tablet Take 25 mg by mouth at bedtime.     [provider]    Family History Family History  Family history unknown: Yes    Social History Social History   Tobacco Use  . Smoking status: Never Smoker  . Smokeless tobacco: Never Used  Substance Use Topics  . Alcohol use: No    Comment: Pt denies   . Drug use: No    Comment: Pt denies     Allergies   Patient has no known allergies.   Review of Systems Review of Systems  Constitutional: Negative for fever.  Respiratory: Negative for cough and shortness of breath.   Cardiovascular: Negative for chest pain.  Gastrointestinal: Negative for abdominal pain.  Psychiatric/Behavioral: Positive for agitation, behavioral problems and dysphoric mood. The patient is nervous/anxious.   All other systems reviewed and are negative.    Physical Exam Updated Vital Signs BP (!) 146/100 (BP Location: Left Arm)   Pulse (!) 111   Temp (!) 100.9 F (38.3 C) (Axillary)   Resp 18   SpO2 94%   Physical Exam Vitals signs and nursing note reviewed.  Constitutional:      General: He is not in acute distress.    Appearance: He is well-developed. He is obese. He is not ill-appearing.     Comments: Calm and cooperative  HENT:     Head: Normocephalic.     Comments: Abrasion over central forehead Eyes:     General: No scleral icterus.       Right eye: No discharge.        Left eye: No discharge.     Conjunctiva/sclera: Conjunctivae normal.     Pupils: Pupils are equal, round, and reactive to light.  Neck:     Musculoskeletal: Normal range of motion.  Cardiovascular:     Rate and Rhythm: Tachycardia present.  Pulmonary:     Effort: Pulmonary effort is normal. No respiratory distress.     Breath sounds: Normal breath sounds.  Abdominal:     General: There is  no distension.     Palpations: Abdomen is soft.     Tenderness: There is no abdominal tenderness.  Skin:    General: Skin is warm and dry.  Neurological:     Mental Status: He is alert and oriented to person, place, and time.  Psychiatric:        Attention and Perception: Attention normal.        Mood and Affect: Affect is flat.        Speech: Speech is delayed.        Behavior: Behavior  normal. Behavior is cooperative.        Thought Content: Thought content is not paranoid or delusional. Thought content does not include homicidal or suicidal ideation. Thought content does not include homicidal or suicidal plan.        Cognition and Memory: Cognition is impaired.        Judgment: Judgment is impulsive.      ED Treatments / Results  Labs (all labs ordered are listed, but only abnormal results are displayed) Labs Reviewed  COMPREHENSIVE METABOLIC PANEL - Abnormal; Notable for the following components:      Result Value   Glucose, Bld 101 (*)    Total Protein 8.7 (*)    All other components within normal limits  ETHANOL  RAPID URINE DRUG SCREEN, HOSP PERFORMED  CBC WITH DIFFERENTIAL/PLATELET  INFLUENZA PANEL BY PCR (TYPE A & B)    EKG None  Radiology Dg Chest 2 View  Result Date: 02/01/2019 CLINICAL DATA:  Cough and fever EXAM: CHEST - 2 VIEW COMPARISON:  08/24/2015 FINDINGS: Cardiac shadow is within normal limits. The lungs are hypoaerated with crowding of the central vascular markings. No focal confluent infiltrate is seen. No bony abnormality is noted. IMPRESSION: Mild increased central markings which are likely accentuated by the poor inspiratory effort. Mild bronchitis could not be excluded. Electronically Signed   By: Alcide Clever M.D.   On: 02/01/2019 19:58    Procedures Procedures (including critical care time)  Medications Ordered in ED Medications - No data to display   Initial Impression / Assessment and Plan / ED Course  I have reviewed the triage vital  signs and the nursing notes.  Pertinent labs & imaging results that were available during my care of the patient were reviewed by me and considered in my medical decision making (see chart for details).  33 year old male presents with aggressive behavior and behavioral problems. He had an axillary temp of 100.9 here and mild tachycardia and HTN. On recheck he had a oral temp of 99.6. The patient denies any infectious symptoms to me. Exam is unremarkable other than abrasion on the forehead from banging his head on the ground. Do not think he needs any imaging currently as injury appears mild and he is alert and oriented.   Labs are normal. CXR and flu test are negative. Repeat vitals are improved. Discussed with patient that we will send him back to his group home. He seems upset about this. I encouraged him to talk to me to tell me why but he cannot give me a reason. I asked him again if anyone is hurting him there and he states no. I asked him where he would rather go and he states he doesn't know. At this time, I don't have concern that he is being abused. He has been calm and cooperative here. No complaints from nursing staff either. Will have [employer] take the patient home. He was encouraged to come back if needed.  Staff from group home are refusing to pick him up tonight and don't want patient brought back by [employer] because this has happened before and if [employer] brings him he will run away again. They are requesting we keep him till the morning. Discussed with Dr. Effie Shy. Will allow patient to stay till tomorrow morning.  Final Clinical Impressions(s) / ED Diagnoses   Final diagnoses:  Behavior problem, adult    ED Discharge Orders    None       Bethel Born, PA-C  02/01/19 2216    Mancel Bale, MD 02/01/19 2225    Bethel Born, PA-C 02/01/19 2232    Mancel Bale, MD 02/02/19 [phone]
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
## 2019-02-01 NOTE — ED Notes (Signed)
Dr Wentz updated 
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
## 2019-02-01 NOTE — ED Notes (Signed)
Pt non-verball, and will not answer most questions. w/o eye contact.  Pt indicates that he has not been feeling well, has had a productive cough and diarrhea.

## 2019-02-01 NOTE — ED Triage Notes (Signed)
IVC paperwork being process per sheriff

## 2019-02-01 NOTE — ED Notes (Signed)
Pt placed on droplet precations

## 2019-02-02 NOTE — Progress Notes (Signed)
CSW spoke with Group Home staff Lajuana Ripple [phone]). Group home is en route to pick up the patient.   CSW contacted patient's legal guardian, DSS Sabino Dick [phone]). CSW spoke with guardian and informed guardian of ED visit and plan to discharge back to group home. Guardian voiced appreciation for call, states she is agreeable to discharge back to group home.  Enid Cutter, LCSW-A Clinical Social Worker

## 2020-10-04 ENCOUNTER — Other Ambulatory Visit: Payer: Medicaid Other

## 2020-10-04 DIAGNOSIS — Z20822 Contact with and (suspected) exposure to covid-19: Secondary | ICD-10-CM

## 2020-10-05 LAB — SARS-COV-2, NAA 2 DAY TAT

## 2020-10-05 LAB — NOVEL CORONAVIRUS, NAA: SARS-CoV-2, NAA: NOT DETECTED

## 2020-11-09 ENCOUNTER — Other Ambulatory Visit: Payer: Medicaid Other

## 2020-11-09 ENCOUNTER — Other Ambulatory Visit: Payer: Self-pay

## 2020-11-09 DIAGNOSIS — Z20822 Contact with and (suspected) exposure to covid-19: Secondary | ICD-10-CM

## 2020-11-10 LAB — NOVEL CORONAVIRUS, NAA: SARS-CoV-2, NAA: NOT DETECTED

## 2020-11-10 LAB — SARS-COV-2, NAA 2 DAY TAT

## 2020-11-19 ENCOUNTER — Other Ambulatory Visit: Payer: Self-pay

## 2020-11-19 ENCOUNTER — Other Ambulatory Visit: Payer: Medicaid Other

## 2020-11-19 DIAGNOSIS — Z20822 Contact with and (suspected) exposure to covid-19: Secondary | ICD-10-CM

## 2020-11-22 LAB — NOVEL CORONAVIRUS, NAA: SARS-CoV-2, NAA: NOT DETECTED

## 2020-12-10 ENCOUNTER — Other Ambulatory Visit: Payer: Medicaid Other

## 2020-12-10 ENCOUNTER — Other Ambulatory Visit: Payer: Self-pay

## 2020-12-10 DIAGNOSIS — Z20822 Contact with and (suspected) exposure to covid-19: Secondary | ICD-10-CM

## 2020-12-11 LAB — SARS-COV-2, NAA 2 DAY TAT

## 2020-12-11 LAB — NOVEL CORONAVIRUS, NAA: SARS-CoV-2, NAA: NOT DETECTED

## 2020-12-15 ENCOUNTER — Other Ambulatory Visit: Payer: Self-pay

## 2020-12-15 ENCOUNTER — Other Ambulatory Visit: Payer: Medicaid Other

## 2020-12-15 DIAGNOSIS — Z20822 Contact with and (suspected) exposure to covid-19: Secondary | ICD-10-CM

## 2020-12-16 LAB — NOVEL CORONAVIRUS, NAA: SARS-CoV-2, NAA: NOT DETECTED

## 2020-12-16 LAB — SARS-COV-2, NAA 2 DAY TAT

## 2020-12-27 ENCOUNTER — Other Ambulatory Visit: Payer: Medicaid Other

## 2020-12-27 DIAGNOSIS — Z20822 Contact with and (suspected) exposure to covid-19: Secondary | ICD-10-CM

## 2020-12-28 LAB — SARS-COV-2, NAA 2 DAY TAT

## 2020-12-28 LAB — NOVEL CORONAVIRUS, NAA: SARS-CoV-2, NAA: NOT DETECTED

## 2021-01-10 ENCOUNTER — Other Ambulatory Visit: Payer: Medicaid Other

## 2021-01-10 ENCOUNTER — Other Ambulatory Visit: Payer: Self-pay

## 2021-01-10 DIAGNOSIS — Z20822 Contact with and (suspected) exposure to covid-19: Secondary | ICD-10-CM

## 2021-01-11 LAB — SARS-COV-2, NAA 2 DAY TAT

## 2021-01-11 LAB — NOVEL CORONAVIRUS, NAA: SARS-CoV-2, NAA: NOT DETECTED

## 2021-03-19 IMAGING — CR CHEST - 2 VIEW
2 series · 2 of 2 positions shown · non-contrast
Comparison: 08/24/2015

CLINICAL DATA: Cough and fever

EXAM:
CHEST - 2 VIEW

[w chest lat]
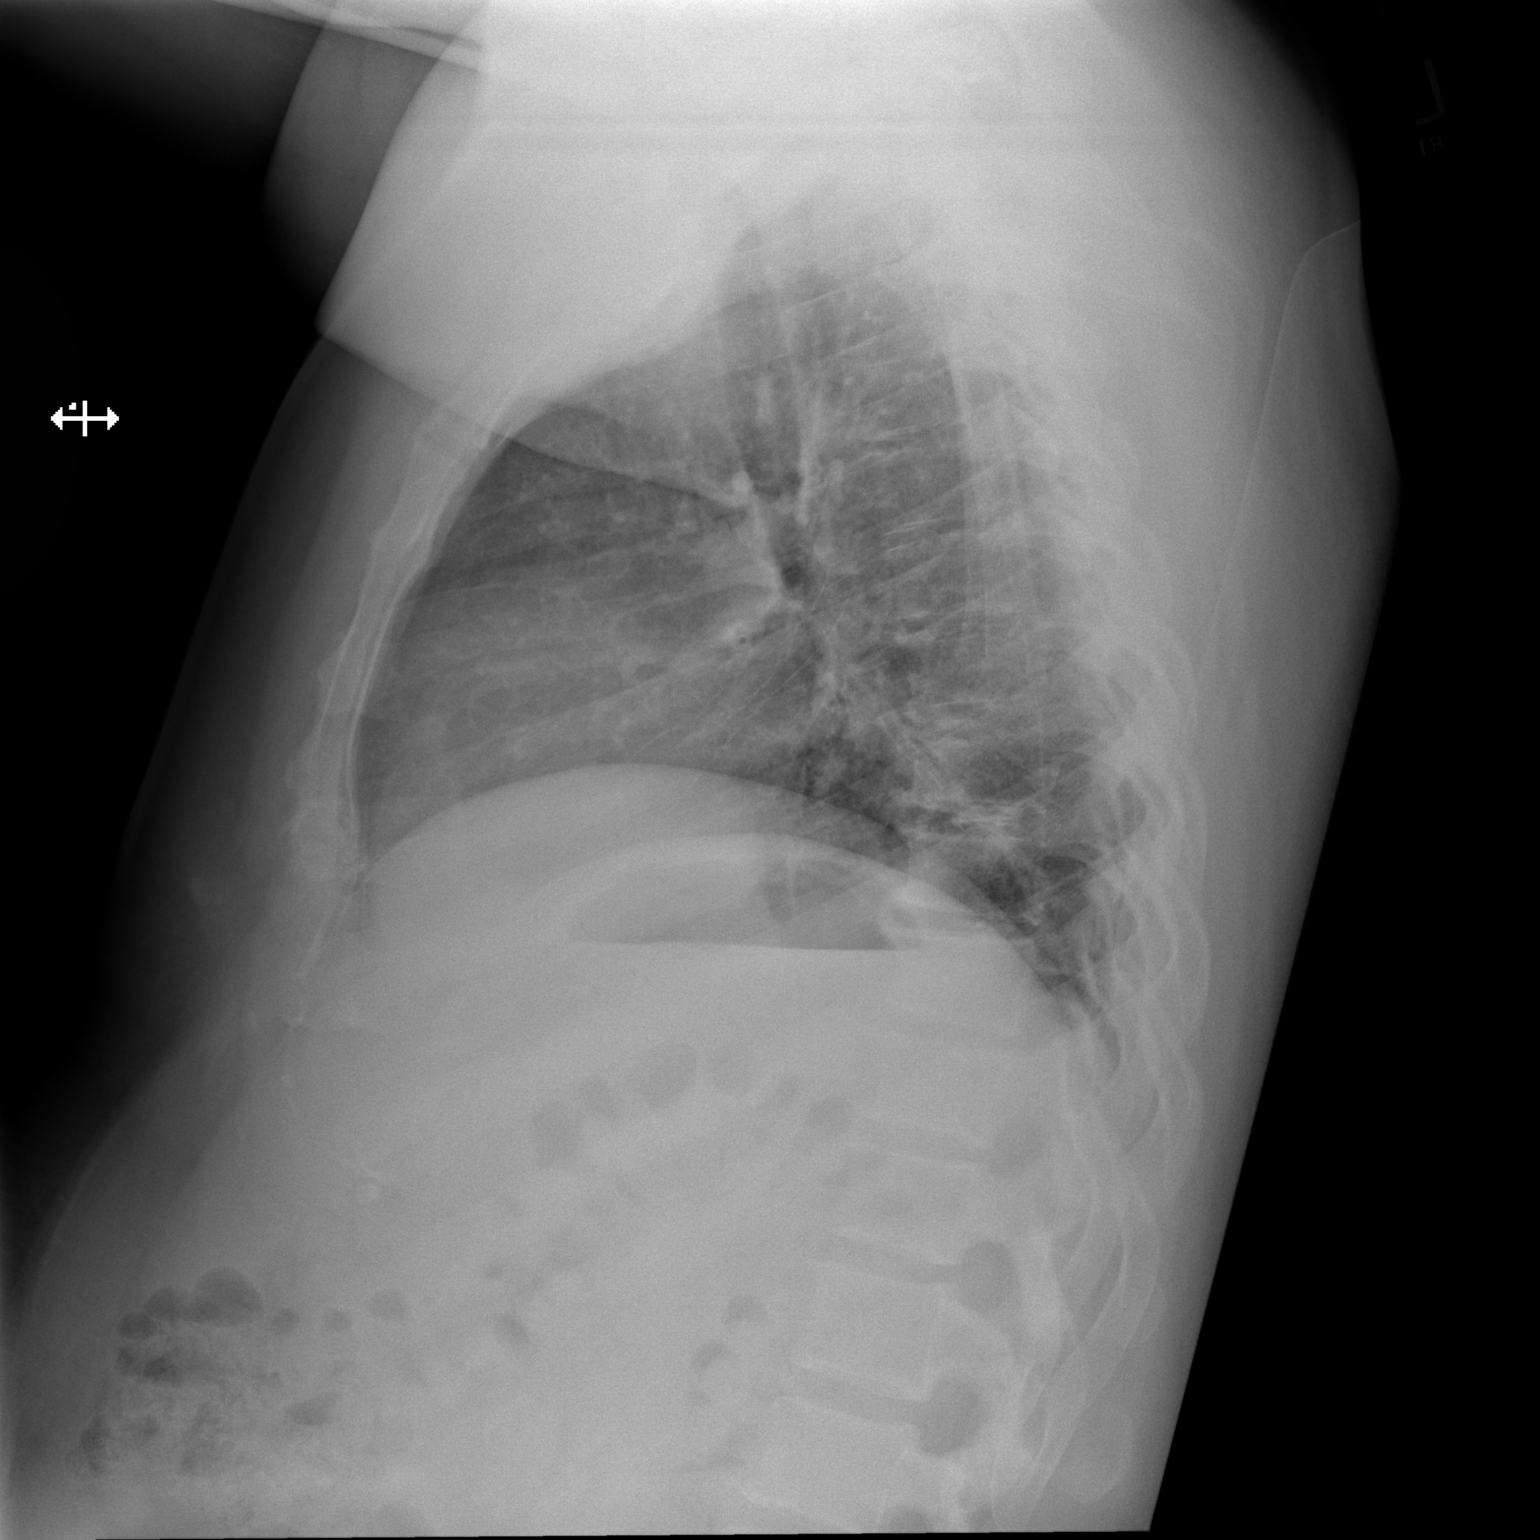

[x chest ap]
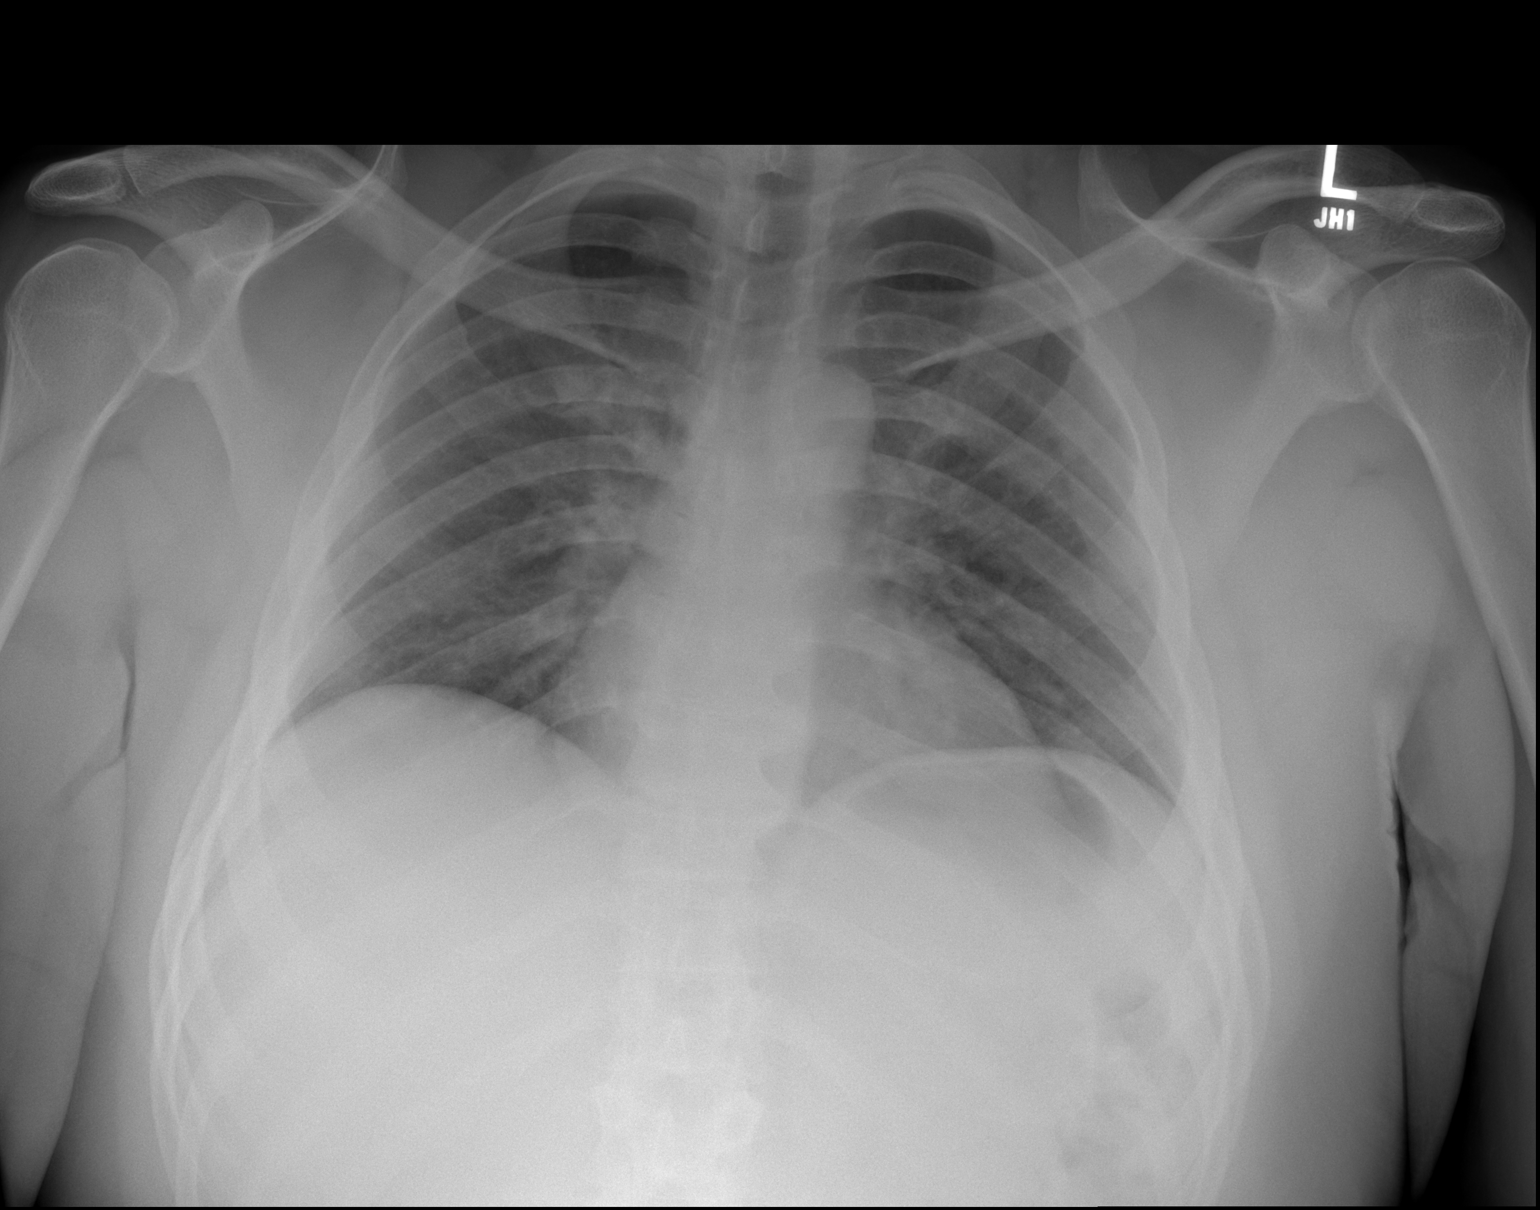

[2 of 2 positions shown; findings below may reference images not displayed]

FINDINGS: Cardiac shadow is within normal limits. The lungs are hypoaerated
with crowding of the central vascular markings. No focal confluent
infiltrate is seen. No bony abnormality is noted.
IMPRESSION: Mild increased central markings which are likely accentuated by the
poor inspiratory effort. Mild bronchitis could not be excluded.

## 2024-12-05 ENCOUNTER — Encounter (INDEPENDENT_AMBULATORY_CARE_PROVIDER_SITE_OTHER): Payer: Self-pay

## 2025-01-12 ENCOUNTER — Institutional Professional Consult (permissible substitution) (INDEPENDENT_AMBULATORY_CARE_PROVIDER_SITE_OTHER)
